# Patient Record
Sex: Male | Born: 1960 | Race: White | Hispanic: No | Smoking: Current every day smoker
Health system: Southern US, Community
[De-identification: ages and names within clinical notes are randomized; demographics above are authoritative.]

## PROBLEM LIST (undated history)

## (undated) DIAGNOSIS — R0602 Shortness of breath: Secondary | ICD-10-CM

## (undated) DIAGNOSIS — F419 Anxiety disorder, unspecified: Secondary | ICD-10-CM

## (undated) HISTORY — PX: HERNIA REPAIR: SHX51

## (undated) HISTORY — DX: Anxiety disorder, unspecified: F41.9

## (undated) HISTORY — PX: WRIST FRACTURE SURGERY: SHX121

## (undated) HISTORY — DX: Shortness of breath: R06.02

---

## 2013-05-22 ENCOUNTER — Ambulatory Visit: Payer: Self-pay | Admitting: Internal Medicine

## 2019-06-28 ENCOUNTER — Other Ambulatory Visit: Payer: Self-pay

## 2019-06-28 ENCOUNTER — Ambulatory Visit
Admission: EM | Admit: 2019-06-28 | Discharge: 2019-06-28 | Disposition: A | Discharge status: Home / Self Care | Payer: BC Managed Care – PPO | Attending: Emergency Medicine | Admitting: Emergency Medicine

## 2019-06-28 DIAGNOSIS — H00012 Hordeolum externum right lower eyelid: Secondary | ICD-10-CM

## 2019-06-28 MED ORDER — DOXYCYCLINE HYCLATE 100 MG PO CAPS: 100.0000 mg | ORAL_CAPSULE | Freq: Two times a day (BID) | ORAL | 0 refills | Status: DC

## 2019-06-28 NOTE — ED Provider Notes (Signed)
[facility]    CSN: [id number] Arrival date & time: 06/28/19  1116     History   Chief Complaint Chief Complaint  Patient presents with  . Eye Pain    HPI Lisabeth DevoidGregory A Plath is a 58 y.o. male presenting with pain and swelling to right eyelid for two weeks. Pt has tried warm compresses and OTC pain meds to help alleviate pain, but pain and swelling has continued to increased. Pt has a history of facial acne, so he believed the "bump" to be a pimple, but wasn't able to manually drain the "pimple". Pt denies fever, chills. additional facial pain, or vision changes. Denies recent bites, MRSA exposure or recent abx use.   Social hx: pt's wife passed away in March. Since her passing, pt noticed an increased amount of stress, and has changed his diet to mostly fast food. Pt hasn't sought out further therapies/counsueling and also hasn't had a PCP for >10 years.   History reviewed. No pertinent past medical history.  There are no active problems to display for this patient.   Past Surgical History:  Procedure Laterality Date  . HERNIA REPAIR    . WRIST FRACTURE SURGERY Left        Home Medications    Family History History reviewed. No pertinent family history.  Social History Social History   Tobacco Use  . Smoking status: Current Every Day Smoker    Packs/day: 0.50    Types: Cigarettes  . Smokeless tobacco: Never Used  Substance Use Topics  . Alcohol use: Yes    Comment: occasionally  . Drug use: Not Currently     Allergies   Patient has no known allergies.   Review of Systems Review of Systems   Physical Exam Triage Vital Signs ED Triage Vitals  Enc Vitals Group     BP 06/28/19 1134 (!) 150/104     Pulse Rate 06/28/19 1134 74     Resp 06/28/19 1134 15     Temp 06/28/19 1134 98.2 F (36.8 C)     Temp Source 06/28/19 1134 Oral     SpO2 06/28/19 1134 97 %     Weight 06/28/19 1132 180 lb (81.6 kg)     Height 06/28/19 1132 6\' 1"  (1.854 m)    Head Circumference --      Peak Flow --      Pain Score 06/28/19 1132 0     Pain Loc --      Pain Edu? --      Excl. in [place]? --    No data found.  Updated Vital Signs BP (!) 150/104 (BP Location: Left Arm)   Pulse 74   Temp 98.2 F (36.8 C) (Oral)   Resp 15   Ht 6\' 1"  (1.854 m)   Wt 180 lb (81.6 kg)   SpO2 97%   BMI 23.75 kg/m   Visual Acuity Right Eye Distance: 20/15 Left Eye Distance: 20/15 Bilateral Distance: 20/15(corrected)  Right Eye Near:   Left Eye Near:    Bilateral Near:     Physical Exam   UC Treatments / Results  Labs (all labs ordered are listed, but only abnormal results are displayed) Labs Reviewed - No data to display  EKG   Radiology No results found.  Procedures Procedures (including critical care time)  Medications Ordered in UC Medications - No data to display  Initial Impression / Assessment and Plan / UC Course  I have reviewed the triage vital signs and  the nursing notes.  Pertinent labs & imaging results that were available during my care of the patient were reviewed by me and considered in my medical decision making (see chart for details).     Pt presents with pain and swelling to right lower eyelid, diagnosed with hordelum and treated as such with the prescribed medications below. Warm compresses suggested and pt told to follow-up with ophthalmologist for possible drainage.   Pt and NP also had a discussion regarding health maintence. Pt given recommendations for PCPs and Counselors in area, NP reviewed lifestyle modifications that could be made to better mange his BP and encouraged smoking cessation. Pt receptive to all the above conversation and education. All questions answered and all concerns addressed.   Final Clinical Impressions(s) / UC Diagnoses   Final diagnoses:  Hordeolum externum of right lower eyelid   Discharge Instructions   None    ED Prescriptions    Medication Sig Dispense Auth. Provider    doxycycline (VIBRAMYCIN) 100 MG capsule Take 1 capsule (100 mg total) by mouth 2 (two) times daily. 20 capsule Gertie Baron, NP      Gertie Baron, DNP, NP-c    Gertie Baron, NP 06/28/19 1218

## 2019-06-28 NOTE — ED Triage Notes (Signed)
Patient complains of eye stye/swelling x 2 weeks. Patient states that area is painful at times.

## 2021-12-17 ENCOUNTER — Ambulatory Visit
Admission: EM | Admit: 2021-12-17 | Discharge: 2021-12-17 | Disposition: A | Discharge status: Home / Self Care | Payer: BC Managed Care – PPO | Attending: Physician Assistant | Admitting: Physician Assistant

## 2021-12-17 ENCOUNTER — Ambulatory Visit (INDEPENDENT_AMBULATORY_CARE_PROVIDER_SITE_OTHER): Payer: BC Managed Care – PPO

## 2021-12-17 ENCOUNTER — Encounter: Payer: Self-pay | Admitting: Emergency Medicine

## 2021-12-17 ENCOUNTER — Other Ambulatory Visit: Payer: Self-pay

## 2021-12-17 DIAGNOSIS — F410 Panic disorder [episodic paroxysmal anxiety] without agoraphobia: Secondary | ICD-10-CM

## 2021-12-17 DIAGNOSIS — R0789 Other chest pain: Secondary | ICD-10-CM

## 2021-12-17 DIAGNOSIS — R251 Tremor, unspecified: Secondary | ICD-10-CM | POA: Diagnosis not present

## 2021-12-17 DIAGNOSIS — R062 Wheezing: Secondary | ICD-10-CM | POA: Diagnosis not present

## 2021-12-17 DIAGNOSIS — Z720 Tobacco use: Secondary | ICD-10-CM | POA: Insufficient documentation

## 2021-12-17 LAB — CBC WITH DIFFERENTIAL/PLATELET
Abs Immature Granulocytes: 0.02 10*3/uL (ref 0.00–0.07)
Basophils Absolute: 0.1 10*3/uL (ref 0.0–0.1)
Basophils Relative: 1 %
Eosinophils Absolute: 0.3 10*3/uL (ref 0.0–0.5)
Eosinophils Relative: 4 %
HCT: 45.6 % (ref 39.0–52.0)
Hemoglobin: 15.5 g/dL (ref 13.0–17.0)
Immature Granulocytes: 0 %
Lymphocytes Relative: 14 %
Lymphs Abs: 1.1 10*3/uL (ref 0.7–4.0)
MCH: 33.5 pg (ref 26.0–34.0)
MCHC: 34 g/dL (ref 30.0–36.0)
MCV: 98.7 fL (ref 80.0–100.0)
Monocytes Absolute: 0.8 10*3/uL (ref 0.1–1.0)
Monocytes Relative: 9 %
Neutro Abs: 5.9 10*3/uL (ref 1.7–7.7)
Neutrophils Relative %: 72 %
Platelets: 228 10*3/uL (ref 150–400)
RBC: 4.62 MIL/uL (ref 4.22–5.81)
RDW: 14.1 % (ref 11.5–15.5)
WBC: 8.2 10*3/uL (ref 4.0–10.5)
nRBC: 0 % (ref 0.0–0.2)

## 2021-12-17 LAB — COMPREHENSIVE METABOLIC PANEL
ALT: 22 U/L (ref 0–44)
AST: 22 U/L (ref 15–41)
Albumin: 4.5 g/dL (ref 3.5–5.0)
Alkaline Phosphatase: 64 U/L (ref 38–126)
Anion gap: 9 (ref 5–15)
BUN: 12 mg/dL (ref 8–23)
CO2: 26 mmol/L (ref 22–32)
Calcium: 9 mg/dL (ref 8.9–10.3)
Chloride: 98 mmol/L (ref 98–111)
Creatinine, Ser: 0.68 mg/dL (ref 0.61–1.24)
GFR, Estimated: >60 mL/min (ref >60)
Glucose, Bld: 100 mg/dL — ABNORMAL HIGH (ref 70–99)
Potassium: 4.2 mmol/L (ref 3.5–5.1)
Sodium: 133 mmol/L — ABNORMAL LOW (ref 135–145)
Total Bilirubin: 0.9 mg/dL (ref 0.3–1.2)
Total Protein: 7.9 g/dL (ref 6.5–8.1)

## 2021-12-17 LAB — TSH: TSH: 1.296 u[IU]/mL (ref 0.350–4.500)

## 2021-12-17 LAB — GLUCOSE, CAPILLARY: Glucose-Capillary: 98 mg/dL (ref 70–99)

## 2021-12-17 MED ORDER — ALBUTEROL SULFATE (2.5 MG/3ML) 0.083% IN NEBU
2.5000 mg | INHALATION_SOLUTION | Freq: Once | RESPIRATORY_TRACT | Status: AC
  Administered 2021-12-17: 2.5 mg via RESPIRATORY_TRACT

## 2021-12-17 MED ORDER — ALBUTEROL SULFATE HFA 108 (90 BASE) MCG/ACT IN AERS: 1.0000 | INHALATION_SPRAY | Freq: Four times a day (QID) | RESPIRATORY_TRACT | 0 refills | Status: DC | PRN

## 2021-12-17 MED ORDER — HYDROXYZINE HCL 25 MG PO TABS: 25.0000 mg | ORAL_TABLET | Freq: Four times a day (QID) | ORAL | 0 refills | Status: AC | PRN

## 2021-12-17 NOTE — ED Provider Notes (Signed)
[facility]    CSN: [id number] Arrival date & time: 12/17/21  1315      History   Chief Complaint Chief Complaint  Patient presents with   Shaking    HPI William Garrett is a 61 y.o. male presenting for concerns about chest pressure and shaking/jittery feeling that has happened to him recently twice.  Patient reports an episode yesterday and 1 today.  Yesterday he says he was just sitting on the couch when it happened.  Today he says he was doing some yard work outside when he suddenly started to feel chest pressure and then felt short of breath.  Patient says he sat down and focused on taking deep breaths in and out and then started feel little better but still shaky.  That is when he came to urgent care today.  He is not currently experiencing any chest pressure.  He does not describe it as pain.  He says he is a smoker and he gets worried about that.  Patient says he has not seen a PCP in a very long time.  He cannot member the last time he has had labs performed.  In addition to the tobacco use, he does report 3 beers and 4 shots of tequila every night.  Patient reports he did not drink Monday or Tuesday night but he has drank the past 2 nights.  Reports he wants to stop drinking and smoking.  Patient says he is not under more stress than normal.  He does report that his wife died 3 years ago.  He also reports that he did not see his family over the holidays.  Reports that he is close with his children and they do not live too far away.  Patient says that the shaking and jittery feeling goes away when he is focused on something else.  He reports that when he was on his way driving to the urgent care he did not really have any symptoms.  Then, when he got here he started to feel more shaky and jittery.  Denies any known medical problems.  No history of anxiety or depression.  No other concerns.  HPI  History reviewed. No pertinent past medical history.  There are no problems to  display for this patient.   Past Surgical History:  Procedure Laterality Date   HERNIA REPAIR     WRIST FRACTURE SURGERY Left        Home Medications    Prior to Admission medications   Medication Sig Start Date End Date Taking? Authorizing Provider  albuterol (VENTOLIN HFA) 108 (90 Base) MCG/ACT inhaler Inhale 1-2 puffs into the lungs every 6 (six) hours as needed for wheezing or shortness of breath. 12/17/21  Yes Danton Clap, PA-C  hydrOXYzine (ATARAX) 25 MG tablet Take 1 tablet (25 mg total) by mouth every 6 (six) hours as needed for up to 10 days for anxiety. 12/17/21 12/27/21 Yes Danton Clap, PA-C    Family History History reviewed. No pertinent family history.  Social History Social History   Tobacco Use   Smoking status: Every Day    Packs/day: 0.50    Types: Cigarettes   Smokeless tobacco: Never  Vaping Use   Vaping Use: Never used  Substance Use Topics   Alcohol use: Yes    Comment: occasionally   Drug use: Not Currently     Allergies   Patient has no known allergies.   Review of Systems Review of Systems  Constitutional:  Negative for fatigue and fever.  Eyes:  Negative for visual disturbance.  Respiratory:  Positive for shortness of breath.   Cardiovascular:  Positive for chest pain (chest pressure). Negative for palpitations.  Gastrointestinal:  Negative for abdominal pain, nausea and vomiting.  Musculoskeletal:  Negative for myalgias.  Neurological:  Positive for tremors (jittery/shaky). Negative for dizziness, syncope, facial asymmetry, weakness, light-headedness, numbness and headaches.  Psychiatric/Behavioral:  Positive for agitation. Negative for dysphoric mood and suicidal ideas. The patient is nervous/anxious.     Physical Exam Triage Vital Signs ED Triage Vitals  Enc Vitals Group     BP 12/17/21 1326 (!) 157/103     Pulse Rate 12/17/21 1326 75     Resp 12/17/21 1326 15     Temp 12/17/21 1326 98.6 F (37 C)     Temp Source  12/17/21 1326 Oral     SpO2 12/17/21 1326 98 %     Weight 12/17/21 1324 157 lb (71.2 kg)     Height 12/17/21 1324 6\' 1"  (1.854 m)     Head Circumference --      Peak Flow --      Pain Score 12/17/21 1324 0     Pain Loc --      Pain Edu? --      Excl. in [place]? --    No data found.  Updated Vital Signs BP (!) 157/103 (BP Location: Left Arm)    Pulse 75    Temp 98.6 F (37 C) (Oral)    Resp 15    Ht 6\' 1"  (1.854 m)    Wt 157 lb (71.2 kg)    SpO2 98%    BMI 20.71 kg/m       Physical Exam Vitals and nursing note reviewed.  Constitutional:      General: He is not in acute distress.    Appearance: Normal appearance. He is well-developed. He is not ill-appearing.  HENT:     Head: Normocephalic and atraumatic.     Nose: Nose normal.     Mouth/Throat:     Mouth: Mucous membranes are moist.     Pharynx: Oropharynx is clear.  Eyes:     General: No scleral icterus.    Conjunctiva/sclera: Conjunctivae normal.  Cardiovascular:     Rate and Rhythm: Normal rate and regular rhythm.     Heart sounds: Normal heart sounds.  Pulmonary:     Effort: Pulmonary effort is normal. No respiratory distress.     Breath sounds: Wheezing (diffuse wheezing throughout all lung fields) present. No rhonchi or rales.  Musculoskeletal:     Cervical back: Neck supple.     Right lower leg: No edema.     Left lower leg: No edema.  Skin:    General: Skin is warm and dry.     Capillary Refill: Capillary refill takes less than 2 seconds.  Neurological:     General: No focal deficit present.     Mental Status: He is alert and oriented to person, place, and time.     Motor: No weakness.     Coordination: Coordination normal.     Comments: Voice shakes at times and so do his arms and hands.  Psychiatric:        Attention and Perception: Attention normal.        Mood and Affect: Mood is anxious.        Speech: Speech is rapid and pressured.        Behavior: Behavior is cooperative.  UC Treatments /  Results  Labs (all labs ordered are listed, but only abnormal results are displayed) Labs Reviewed  COMPREHENSIVE METABOLIC PANEL - Abnormal; Notable for the following components:      Result Value   Sodium 133 (*)    Glucose, Bld 100 (*)    All other components within normal limits  GLUCOSE, CAPILLARY  CBC WITH DIFFERENTIAL/PLATELET  TSH  CBG MONITORING, ED    EKG   Radiology DG Chest 2 View  Result Date: 12/17/2021 CLINICAL DATA:  Jittery feeling for the past few weeks. EXAM: CHEST - 2 VIEW COMPARISON:  None. FINDINGS: The heart size and mediastinal contours are within normal limits. Normal pulmonary vascularity. The lungs are hyperinflated. No focal consolidation, pleural effusion, or pneumothorax. No acute osseous abnormality. IMPRESSION: 1. No acute cardiopulmonary disease. Electronically Signed   By: Titus Dubin M.D.   On: 12/17/2021 14:33    Procedures ED EKG  Date/Time: 12/17/2021 3:46 PM Performed by: Danton Clap, PA-C Authorized by: Danton Clap, PA-C   ECG reviewed by ED Physician in the absence of a cardiologist: yes   Previous ECG:    Previous ECG:  Unavailable Interpretation:    Interpretation: normal   Rate:    ECG rate:  62   ECG rate assessment: normal   Rhythm:    Rhythm: sinus rhythm   Ectopy:    Ectopy: none   QRS:    QRS axis:  Normal   QRS intervals:  Normal   QRS conduction: normal   ST segments:    ST segments:  Normal T waves:    T waves: normal   Comments:     Normal sinus rhythm with regular rate (including critical care time)  Medications Ordered in UC Medications  albuterol (PROVENTIL) (2.5 MG/3ML) 0.083% nebulizer solution 2.5 mg (2.5 mg Nebulization Given 12/17/21 1440)    Initial Impression / Assessment and Plan / UC Course  I have reviewed the triage vital signs and the nursing notes.  Pertinent labs & imaging results that were available during my care of the patient were reviewed by me and considered in my medical  decision making (see chart for details).  61 year old male presenting for concerns regarding feeling shaky and jittery and also having chest pressure.  Reports an episode that occurred yesterday and today.  He says both lasted for a couple of hours.  Symptoms improve when he is distracted or if he focuses on his breathing.  BP elevated 157/103 but patient is visibly anxious and shaky.  He has diffuse wheezing throughout all lung fields.  Heart regular  CBC and CMP are unrevealing.  TSH ordered and pending.  Chest x-ray obtained to assess for evidence of COPD, pneumonia, masses.  Imaging normal.  Discussed this with patient but advised him that I still believe he may have mild underlying COPD given that he has been a longtime smoker and has wheezing throughout his chest.  I believe he may have gotten short of breath and this caused him to have a chest pressure started to panic.  Patient was given albuterol nebulizer in clinic and he reported that it helped him to breathe better.  Advised him that I have sent an albuterol inhaler to pharmacy.  If he has another episode like this, advised him to use the albuterol but if that does not help then he should focus on his breathing and taking the hydroxyzine that I sent to the pharmacy.  Had a long discussion about  discontinuing his tobacco use and alcohol use, and I advised him to do so slowly.  Also encouraged him to order other primary care office on Monday to get set up with a PCP.  Patient given information crisis center in case his symptoms were to worsen.  Also advised to be eating.   Final Clinical Impressions(s) / UC Diagnoses   Final diagnoses:  Panic attack  Shaky  Chest pressure  Tobacco abuse  Wheezing     Discharge Instructions      -Your lab work looks good.  The thyroid test will be back tomorrow and someone will contact you if it is abnormal. - Chest x-ray is normal but I still believe you probably have mild underlying COPD.  It  would need to be diagnosed with pulmonary function testing so it is important that you make a follow-up with the PCP as soon as possible. - Slowly decrease your alcohol intake and eventually quit drinking.  Same with the tobacco use. - Continue to try to focus on your breathing and meditate.  I have sent a medication you can take if you feel like you are having a panic attack.  It might make you little drowsy so make sure you are home when you take it.  -I am glad the albuterol nebulizer helps.  Have sent inhaler to the pharmacy.  Contact for appointment: [facility] at [facility], [place], [address]. Phone number: [phone] Please call this number to establish with primary care   If you are in crisis:  [place], [facility] [address], [place], [place] [address] Same-Day Access Hours:  Monday, Wednesday and Friday, [place]: Walk-In Crisis Hours: 7 days/week, 8am - 12am     ED Prescriptions     Medication Sig Dispense Auth. Provider   hydrOXYzine (ATARAX) 25 MG tablet Take 1 tablet (25 mg total) by mouth every 6 (six) hours as needed for up to 10 days for anxiety. 40 tablet Laurene Footman B, PA-C   albuterol (VENTOLIN HFA) 108 (90 Base) MCG/ACT inhaler Inhale 1-2 puffs into the lungs every 6 (six) hours as needed for wheezing or shortness of breath. 1 g Danton Clap, PA-C      I have reviewed the PDMP during this encounter.   Danton Clap, PA-C 12/17/21 1551

## 2021-12-17 NOTE — ED Triage Notes (Signed)
Patient states that he has off and on shaking or jittery feeling that occurs off and on for the past couple of weeks.  Patient reports taking a friend's Vicodin pills (4 ) over the past couple of weeks.  Patient denies any stress or depression.  Patient denies any chest pain or SOB.

## 2021-12-17 NOTE — Discharge Instructions (Addendum)
-  Your lab work looks good.  The thyroid test will be back tomorrow and someone will contact you if it is abnormal. - Chest x-ray is normal but I still believe you probably have mild underlying COPD.  It would need to be diagnosed with pulmonary function testing so it is important that you make a follow-up with the PCP as soon as possible. - Slowly decrease your alcohol intake and eventually quit drinking.  Same with the tobacco use. - Continue to try to focus on your breathing and meditate.  I have sent a medication you can take if you feel like you are having a panic attack.  It might make you little drowsy so make sure you are home when you take it.  -I am glad the albuterol nebulizer helps.  Have sent inhaler to the pharmacy.  Contact for appointment: [facility] at [facility], [place], [place], [address]. Phone number: [phone] Please call this number to establish with primary care   If you are in crisis:  [place], [facility] [address], [place], [place] [address] Same-Day Access Hours:  Monday, Wednesday and Friday, [place]: Walk-In Crisis Hours: 7 days/week, 8am - 12am

## 2023-06-22 ENCOUNTER — Ambulatory Visit
Admission: EM | Admit: 2023-06-22 | Discharge: 2023-06-22 | Disposition: A | Discharge status: Home / Self Care | Payer: BC Managed Care – PPO | Attending: Physician Assistant | Admitting: Physician Assistant

## 2023-06-22 ENCOUNTER — Other Ambulatory Visit: Payer: Self-pay

## 2023-06-22 DIAGNOSIS — F419 Anxiety disorder, unspecified: Secondary | ICD-10-CM | POA: Diagnosis not present

## 2023-06-22 DIAGNOSIS — R0602 Shortness of breath: Secondary | ICD-10-CM

## 2023-06-22 DIAGNOSIS — Z720 Tobacco use: Secondary | ICD-10-CM | POA: Diagnosis not present

## 2023-06-22 DIAGNOSIS — R079 Chest pain, unspecified: Secondary | ICD-10-CM | POA: Diagnosis not present

## 2023-06-22 MED ORDER — HYDROXYZINE HCL 25 MG PO TABS: 25.0000 mg | ORAL_TABLET | Freq: Four times a day (QID) | ORAL | 0 refills | Status: AC | PRN

## 2023-06-22 MED ORDER — ALBUTEROL SULFATE HFA 108 (90 BASE) MCG/ACT IN AERS: 1.0000 | INHALATION_SPRAY | Freq: Four times a day (QID) | RESPIRATORY_TRACT | 0 refills | Status: DC | PRN

## 2023-06-22 NOTE — ED Provider Notes (Signed)
[facility]    CSN: [id number] Arrival date & time: 06/22/23  0809      History   Chief Complaint Chief Complaint  Patient presents with   Anxiety   Shortness of Breath    HPI William Garrett is a 62 y.o. male presenting for shortness of breath intermittently, more so when active for the past couple of years.  Patient is a longtime smoker.  He also reports a history of anxiety.  He was seen here a year and a half ago by myself.  Patient says he does not feel nearly as badly today as he did a year and a half ago.  Presentation was consistent with anxiety attack and COPD.  An x-ray was performed which did not show any obvious COPD but he did have a lot of wheezing and he reported improvement with albuterol inhaler and cutting back on smoking.  He also reports that the hydroxyzine calms him down within 30 to 40 minutes of taking it and he is made the hydroxyzine last for a year and a half as he has anxiety attacks about once a month, sometimes a little more frequent or less frequent depending on "what is going on in my life."  He says when he has a anxiety attack it causes a little bit of chest pressure and a jittery feeling.  He reports he finished his hydroxyzine yesterday that he had leftover.  He states his mother broke her hip a few weeks ago and then his best friend passed away unexpectedly 2 days ago.  He reports loss of his wife 4 years ago.  Patient states he has a lot of stressors and things that make him anxious.  He does report he has a lot of close friends and family who support him.  In addition to the tobacco abuse he does report alcohol abuse.  States he has decreased his alcohol consumption to a couple of beers a day.  Previously he was drinking 3 or more beers a day and 4 shots of tequila.  He reports he rarely has shots of hard liquor anymore.  He is not currently experiencing any chest pressure.  He does not describe it as pain.  He says he is mostly here to get a  refill of the albuterol inhaler and hydroxyzine since it has treated his conditions well.  He did not follow-up with a primary care provider as advised a year and half ago.  He says he just did not get around to it.  He is open to making an appointment today.  HPI  History reviewed. No pertinent past medical history.  There are no problems to display for this patient.   Past Surgical History:  Procedure Laterality Date   HERNIA REPAIR     WRIST FRACTURE SURGERY Left        Home Medications    Prior to Admission medications   Medication Sig Start Date End Date Taking? Authorizing Provider  hydrOXYzine (ATARAX) 25 MG tablet Take 1 tablet (25 mg total) by mouth every 6 (six) hours as needed for up to 10 days for anxiety. 06/22/23 07/02/23 Yes Shirlee Latch, PA-C  albuterol (VENTOLIN HFA) 108 (90 Base) MCG/ACT inhaler Inhale 1-2 puffs into the lungs every 6 (six) hours as needed for wheezing or shortness of breath. 06/22/23   Shirlee Latch, PA-C    Family History History reviewed. No pertinent family history.  Social History Social History   Tobacco Use  Smoking status: Every Day    Current packs/day: 0.50    Types: Cigarettes   Smokeless tobacco: Never  Vaping Use   Vaping status: Never Used  Substance Use Topics   Alcohol use: Yes    Comment: occasionally   Drug use: Not Currently     Allergies   Patient has no known allergies.   Review of Systems Review of Systems  Constitutional:  Negative for fatigue and fever.  Eyes:  Negative for visual disturbance.  Respiratory:  Positive for shortness of breath and wheezing. Negative for chest tightness.   Cardiovascular:  Negative for chest pain (chest pressure), palpitations and leg swelling.  Gastrointestinal:  Negative for abdominal pain, nausea and vomiting.  Musculoskeletal:  Negative for myalgias.  Neurological:  Positive for tremors (jittery/shaky). Negative for dizziness, syncope, facial asymmetry, weakness,  light-headedness, numbness and headaches.  Psychiatric/Behavioral:  Positive for agitation. Negative for dysphoric mood and suicidal ideas. The patient is nervous/anxious.      Physical Exam Triage Vital Signs ED Triage Vitals  Enc Vitals Group     BP 12/17/21 1326 (!) 157/103     Pulse Rate 12/17/21 1326 75     Resp 12/17/21 1326 15     Temp 12/17/21 1326 98.6 F (37 C)     Temp Source 12/17/21 1326 Oral     SpO2 12/17/21 1326 98 %     Weight 12/17/21 1324 157 lb (71.2 kg)     Height 12/17/21 1324 6\' 1"  (1.854 m)     Head Circumference --      Peak Flow --      Pain Score 12/17/21 1324 0     Pain Loc --      Pain Edu? --      Excl. in GC? --    No data found.  Updated Vital Signs BP 118/88   Pulse 82   Temp 98.3 F (36.8 C)   Resp 20   SpO2 96%       Physical Exam Vitals and nursing note reviewed.  Constitutional:      General: He is not in acute distress.    Appearance: Normal appearance. He is well-developed. He is not ill-appearing.  HENT:     Head: Normocephalic and atraumatic.     Nose: Nose normal.     Mouth/Throat:     Mouth: Mucous membranes are moist.     Pharynx: Oropharynx is clear.  Eyes:     General: No scleral icterus.    Conjunctiva/sclera: Conjunctivae normal.  Cardiovascular:     Rate and Rhythm: Normal rate and regular rhythm.     Heart sounds: Normal heart sounds.  Pulmonary:     Effort: Pulmonary effort is normal. No respiratory distress.     Breath sounds: Wheezing (diffuse wheezing throughout all lung fields) present. No rhonchi or rales.  Musculoskeletal:     Cervical back: Neck supple.     Right lower leg: No edema.     Left lower leg: No edema.  Skin:    General: Skin is warm and dry.     Capillary Refill: Capillary refill takes less than 2 seconds.  Neurological:     General: No focal deficit present.     Mental Status: He is alert and oriented to person, place, and time.     Motor: No weakness.     Coordination:  Coordination normal.  Psychiatric:        Attention and Perception: Attention normal.  Mood and Affect: Mood normal.        Speech: Speech normal.        Behavior: Behavior is cooperative.      UC Treatments / Results  Labs (all labs ordered are listed, but only abnormal results are displayed) Labs Reviewed - No data to display   EKG   Radiology No results found.  Procedures ED EKG  Date/Time: 06/22/2023 9:51 AM  Performed by: Shirlee Latch, PA-C Authorized by: Shirlee Latch, PA-C   Previous ECG:    Previous ECG:  Compared to current   Similarity:  Changes noted   Comparison ECG info:  Short PR Interpretation:    Interpretation: abnormal   Quality:    Tracing quality:  Limited by artifact Rate:    ECG rate:  79   ECG rate assessment: normal   Rhythm:    Rhythm: sinus rhythm and A-V block     A-V block: 1st Degree   Ectopy:    Ectopy: none   QRS:    QRS axis:  Normal   QRS intervals:  Normal   QRS conduction: normal   ST segments:    ST segments:  Normal T waves:    T waves: normal   Comments:     Normal sinus rhythm.  Regular rate.  Short PR interval.  (including critical care time)  Medications Ordered in UC Medications - No data to display   Initial Impression / Assessment and Plan / UC Course  I have reviewed the triage vital signs and the nursing notes.  Pertinent labs & imaging results that were available during my care of the patient were reviewed by me and considered in my medical decision making (see chart for details).  62 year old male presenting for concerns regarding feeling intermittent anxiety and shortness of breath over the past couple years.  Seen here a year and a half ago and did have workup performed at that time.  I saw him during his previous visit.  He was given albuterol inhaler for suspected mild underlying COPD since he is a longtime smoker.  Also suspected to have anxiety/panic attacks related to environmental  factors.  Patient reports feeling better at this time that he did a year and a half ago.  Reports the albuterol helps his breathing and wheezing.  He says the hydroxyzine calms him down a lot.  Vitals are all normal and stable.  He does not appear overly anxious or in any distress.  He has diffuse wheezing throughout all lung fields.  Heart regular rate and rhythm.  EKG performed today shows normal sinus rhythm and rate.  Artifact noted.  Short PR compared to previous EKG.  Patient symptoms still appear to be consistent with mild underlying COPD and panic attack.  It seems that he has cut back on smoking and drinking.  Advised him to continue to cut back.  Advised him that I have sent an albuterol inhaler to pharmacy.  If he has another episode like this, advised him to use the albuterol but if that does not help then he should focus on his breathing and taking the hydroxyzine that I sent to the pharmacy.  Had a long discussion about discontinuing his tobacco use and alcohol use, and I advised him to do so slowly.  Also encouraged him to follow-up with a PCP.  Nursing staff made him an appointment for next month.  Patient given information crisis center in case his symptoms were to worsen.  Final Clinical Impressions(s) / UC Diagnoses   Final diagnoses:  Anxiety  Shortness of breath  Tobacco abuse     Discharge Instructions      - I still believe you probably have mild underlying COPD.  It would need to be diagnosed with pulmonary function testing so it is important that you follow-up with the PCP as soon as possible. I refilled the inhaler.  - Continue slowly decreasing your alcohol intake and eventually quit drinking.  Same with the tobacco use. - Continue to try to focus on your breathing and meditate.  I have sent a medication you can take if you feel like you are having a panic attack. I am glad this medication helped you before. Keep leaning on friends and family for support.   If  you are in crisis:  [place], [facility] [address], [place], [place] [address] Same-Day Access Hours:  Monday, Wednesday and Friday, 8am - 3pm Crisis & Diversion Center: Walk-In Crisis Hours: 7 days/week, 8am - 12am      ED Prescriptions     Medication Sig Dispense Auth. Provider   albuterol (VENTOLIN HFA) 108 (90 Base) MCG/ACT inhaler Inhale 1-2 puffs into the lungs every 6 (six) hours as needed for wheezing or shortness of breath. 1 g Eusebio Friendly B, PA-C   hydrOXYzine (ATARAX) 25 MG tablet Take 1 tablet (25 mg total) by mouth every 6 (six) hours as needed for up to 10 days for anxiety. 40 tablet Gareth Morgan      PDMP not reviewed this encounter.     Shirlee Latch, PA-C 06/22/23 [phone]

## 2023-06-22 NOTE — ED Triage Notes (Addendum)
Pt has been experiencing intermittent SOB for a few days feels it is anxiety related due to multiple family losses and the hospitalization of his mom. Has a smokers cough but denies cough is new but admits to low grade fever on Saturday.

## 2023-06-22 NOTE — Discharge Instructions (Addendum)
-   I still believe you probably have mild underlying COPD.  It would need to be diagnosed with pulmonary function testing so it is important that you follow-up with the PCP as soon as possible. I refilled the inhaler.  - Continue slowly decreasing your alcohol intake and eventually quit drinking.  Same with the tobacco use. - Continue to try to focus on your breathing and meditate.  I have sent a medication you can take if you feel like you are having a panic attack. I am glad this medication helped you before. Keep leaning on friends and family for support.   If you are in crisis:  [place], [facility] [address] [place], [place] [address] Same-Day Access Hours:  Monday, Wednesday and Friday, 8am - 3pm Crisis & Diversion Center: Walk-In Crisis Hours: 7 days/week, 8am - 12am

## 2023-07-18 ENCOUNTER — Encounter: Payer: Self-pay | Admitting: General Practice

## 2023-08-01 ENCOUNTER — Encounter: Payer: Self-pay | Admitting: Physician Assistant

## 2023-08-01 ENCOUNTER — Ambulatory Visit: Payer: BC Managed Care – PPO | Admitting: Physician Assistant

## 2023-08-01 VITALS — BP 124/88 | HR 85 | Temp 97.9°F | Ht 73.0 in | Wt 162.0 lb

## 2023-08-01 DIAGNOSIS — Z13220 Encounter for screening for lipoid disorders: Secondary | ICD-10-CM

## 2023-08-01 DIAGNOSIS — J410 Simple chronic bronchitis: Secondary | ICD-10-CM | POA: Insufficient documentation

## 2023-08-01 DIAGNOSIS — Z0000 Encounter for general adult medical examination without abnormal findings: Secondary | ICD-10-CM | POA: Diagnosis not present

## 2023-08-01 DIAGNOSIS — Z114 Encounter for screening for human immunodeficiency virus [HIV]: Secondary | ICD-10-CM

## 2023-08-01 DIAGNOSIS — Z23 Encounter for immunization: Secondary | ICD-10-CM

## 2023-08-01 DIAGNOSIS — F17200 Nicotine dependence, unspecified, uncomplicated: Secondary | ICD-10-CM | POA: Diagnosis not present

## 2023-08-01 DIAGNOSIS — Z1159 Encounter for screening for other viral diseases: Secondary | ICD-10-CM

## 2023-08-01 DIAGNOSIS — Z125 Encounter for screening for malignant neoplasm of prostate: Secondary | ICD-10-CM

## 2023-08-01 DIAGNOSIS — Z1211 Encounter for screening for malignant neoplasm of colon: Secondary | ICD-10-CM

## 2023-08-01 NOTE — Progress Notes (Signed)
Date:  08/01/2023   Name:  William Garrett   DOB:  05-09-1961   MRN:  [id number]   Chief Complaint: Establish Care and Cough (Had chest xray in 2023 normal stated it may be the beginning of COPD)  HPI  William Garrett is a 62 y.o. male who presents today for his Complete Annual Exam. He feels well. He reports exercising at work. He reports he is sleeping well.   Last Dental Exam: >3y ago Last Eye Exam: 3y ago Last CRC screen: never Last PSA: never  William Garrett is a very pleasant 62 year old male with a history of cigarette smoking but otherwise seemingly healthy, new to the practice today to establish care.  He has been lost to primary care for decades and generally does not like doctors visits.  Naturally he is overdue for all cancer screenings.  Cigarette smoking has been light (less than 5 cigarettes/day) but prolonged ~45 years duration.  He is working on quitting gradually.  He was seen in urgent care January 2023 for chest tightness and "jitteriness", thought to be related to anxiety and/or alcohol use; chest x-ray from this date significant for hyperinflation bilaterally to suggest COPD when combined with chronic wheezing in all lung fields.  It was recommended that he establish with PCP at the time, but he did not feel it was necessary.  Albuterol helps with breathing, so he uses his inhaler sparingly (usually every 1 to 2 weeks as needed).  He was prescribed hydroxyzine for as needed use for anxiety, which has been working well.  More recently he was seen in urgent care 06/22/2023 once again for anxiety/shortness of breath.  Alcohol consumption had decreased to about 2 beers per day.  Immunization History  Administered Date(s) Administered   Tdap 08/01/2023   Health Maintenance Due  Topic Date Due   HIV Screening  Never done   Hepatitis C Screening  Never done   Colonoscopy  Never done   Zoster Vaccines- Shingrix (1 of 2) Never done   COVID-19 Vaccine (1 - 2023-24 season) Never  done   INFLUENZA VACCINE  07/13/2023    No results found for: "PSA1", "PSA"         Medication list has been reviewed and updated.  Current Meds  Medication Sig   albuterol (VENTOLIN HFA) 108 (90 Base) MCG/ACT inhaler Inhale 1-2 puffs into the lungs every 6 (six) hours as needed for wheezing or shortness of breath.   hydrOXYzine (ATARAX) 25 MG tablet Take 25 mg by mouth 3 (three) times daily as needed for anxiety.     Review of Systems  Constitutional:  Negative for activity change, appetite change, fatigue and unexpected weight change.  HENT:  Negative for dental problem, hearing loss and trouble swallowing.   Eyes:  Negative for visual disturbance.  Respiratory:  Positive for cough, shortness of breath and wheezing. Negative for chest tightness.   Cardiovascular:  Negative for chest pain, palpitations and leg swelling.  Gastrointestinal:  Negative for abdominal distention, abdominal pain, blood in stool, constipation and diarrhea.  Endocrine: Negative for polydipsia, polyphagia and polyuria.  Genitourinary:  Negative for difficulty urinating, dysuria, enuresis, frequency, hematuria and testicular pain.  Musculoskeletal:  Negative for arthralgias, gait problem and joint swelling.  Skin:  Negative for rash and wound.  Neurological:  Negative for dizziness, syncope, weakness and headaches.  Psychiatric/Behavioral:  Negative for behavioral problems and sleep disturbance.     There are no problems to display for this patient.  No Known Allergies  Immunization History  Administered Date(s) Administered   Tdap 08/01/2023    Past Surgical History:  Procedure Laterality Date   HERNIA REPAIR     WRIST FRACTURE SURGERY Left     Social History   Tobacco Use   Smoking status: Every Day    Current packs/day: 0.25    Average packs/day: 0.3 packs/day for 45.6 years (11.4 ttl pk-yrs)    Types: Cigarettes    Start date: 1979   Smokeless tobacco: Never  Vaping Use    Vaping status: Never Used  Substance Use Topics   Alcohol use: Yes    Comment: occasionally   Drug use: Not Currently    Family History  Problem Relation Age of Onset   Stroke Father    Diabetes Father         08/01/2023    8:38 AM  GAD 7 : Generalized Anxiety Score  Nervous, Anxious, on Edge 0  Control/stop worrying 0  Worry too much - different things 0  Trouble relaxing 0  Restless 0  Easily annoyed or irritable 0  Afraid - awful might happen 0  Total GAD 7 Score 0  Anxiety Difficulty Not difficult at all       08/01/2023    8:38 AM  Depression screen PHQ 2/9  Decreased Interest 0  Down, Depressed, Hopeless 0  PHQ - 2 Score 0  Altered sleeping 0  Tired, decreased energy 0  Change in appetite 0  Feeling bad or failure about yourself  0  Trouble concentrating 0  Moving slowly or fidgety/restless 0  Suicidal thoughts 0  PHQ-9 Score 0  Difficult doing work/chores Not difficult at all    BP Readings from Last 3 Encounters:  08/01/23 124/88  06/22/23 118/88  12/17/21 (!) 157/103    Wt Readings from Last 3 Encounters:  08/01/23 162 lb (73.5 kg)  12/17/21 157 lb (71.2 kg)  06/28/19 180 lb (81.6 kg)    BP 124/88   Pulse 85   Temp 97.9 F (36.6 C) (Oral)   Ht 6\' 1"  (1.854 m)   Wt 162 lb (73.5 kg)   SpO2 93%   BMI 21.37 kg/m   Physical Exam Vitals and nursing note reviewed.  Constitutional:      Appearance: Normal appearance.  HENT:     Ears:     Comments: EAC clear bilaterally with good view of TM which is without effusion or erythema.     Nose: Nose normal.     Mouth/Throat:     Mouth: Mucous membranes are moist. No oral lesions.     Dentition: Normal dentition.     Pharynx: No posterior oropharyngeal erythema.  Eyes:     Extraocular Movements: Extraocular movements intact.     Conjunctiva/sclera: Conjunctivae normal.     Pupils: Pupils are equal, round, and reactive to light.  Neck:     Thyroid: No thyromegaly.  Cardiovascular:     Rate  and Rhythm: Normal rate and regular rhythm.     Heart sounds: No murmur heard.    No friction rub. No gallop.     Comments: Pulses 2+ at radial, PT, DP bilaterally. No carotid bruit. No peripheral edema Pulmonary:     Effort: Pulmonary effort is normal.     Breath sounds: Wheezing (all fields) present. No rales.  Abdominal:     General: Bowel sounds are normal.     Palpations: Abdomen is soft. There is no mass.  Tenderness: There is no abdominal tenderness.  Genitourinary:    Comments: Deferred. Reviewed self-exam technique Musculoskeletal:     Comments: Full ROM with strength 5/5 bilateral upper and lower extremities  Lymphadenopathy:     Cervical: No cervical adenopathy.  Skin:    General: Skin is warm.     Capillary Refill: Capillary refill takes less than 2 seconds.     Findings: No lesion or rash.  Neurological:     Mental Status: He is alert and oriented to person, place, and time.     Gait: Gait is intact.  Psychiatric:        Mood and Affect: Mood normal.        Behavior: Behavior normal.     Recent Labs     Component Value Date/Time   NA 133 (L) 12/17/2021 1435   K 4.2 12/17/2021 1435   CL 98 12/17/2021 1435   CO2 26 12/17/2021 1435   GLUCOSE 100 (H) 12/17/2021 1435   BUN 12 12/17/2021 1435   CREATININE 0.68 12/17/2021 1435   CALCIUM 9.0 12/17/2021 1435   PROT 7.9 12/17/2021 1435   ALBUMIN 4.5 12/17/2021 1435   AST 22 12/17/2021 1435   ALT 22 12/17/2021 1435   ALKPHOS 64 12/17/2021 1435   BILITOT 0.9 12/17/2021 1435   GFRNONAA >60 12/17/2021 1435    Lab Results  Component Value Date   WBC 8.2 12/17/2021   HGB 15.5 12/17/2021   HCT 45.6 12/17/2021   MCV 98.7 12/17/2021   PLT 228 12/17/2021   No results found for: "HGBA1C" No results found for: "CHOL", "HDL", "LDLCALC", "LDLDIRECT", "TRIG", "CHOLHDL" Lab Results  Component Value Date   TSH 1.296 12/17/2021     Assessment and Plan:  1. Annual physical exam Mostly healthy patient with minor  abnormalities on exam. Encouraged healthy lifestyle including regular physical activity and consumption of whole fruits and vegetables.  Encouraged routine eye and dental exams, as well as avoidance of harmful substances including tobacco and alcohol.  Obtain baseline labs today.  - CBC with Differential/Platelet - Comprehensive metabolic panel - TSH  2. Smokers' cough (HCC) Very likely has COPD with hyperinflation on x-ray from 2023 and wheezing heard in all lung fields.  Continue albuterol inhaler as needed for now.  Consider pulmonology referral for PFTs in the future.  3. Tobacco use disorder Encouraged cessation.  Patient is not quite ready to quit, says he enjoys a cigarette with coffee or in social settings.  Aware of options for nicotine replacement therapy or pharmacotherapy when ready  4. Need for Tdap vaccination Tdap booster administered today - Tdap vaccine greater than or equal to 7yo IM  5. Screening for HIV (human immunodeficiency virus) One-time HIV screen today - HIV Antibody (routine testing w rflx)  6. Need for hepatitis C screening test One-time hep C screen today - Hepatitis C antibody  7. Screening for colon cancer Suggested colonoscopy might be best in this patient who is late to begin screening and has long smoking history, but refused.  He was willing however to proceed with Cologuard as initial CRC screening. - Cologuard  8. Screening PSA (prostate specific antigen) Check PSA today - PSA  9. Screening for hyperlipidemia Check lipids - Lipid panel   Return in about 1 year (around 07/31/2024) for CPE.  Sooner pending lab results   Alvester Morin, PA-C, DMSc, Nutritionist [facility] [phone]

## 2023-08-01 NOTE — Patient Instructions (Signed)
-  It was a pleasure to see you today! Please review your visit summary for helpful information -Lab results are usually available within 1-2 days and we will call once reviewed -I would encourage you to follow your care via MyChart where you can access lab results, notes, messages, and more -If you feel that we did a nice job today, please complete your after-visit survey and leave us a Google review! Your CMA today was Kieandra and your provider was Dan Waddell, PA-C, DMSc -Please return for follow-up in about 1y   

## 2023-08-02 ENCOUNTER — Other Ambulatory Visit: Payer: Self-pay | Admitting: Physician Assistant

## 2023-08-02 DIAGNOSIS — E7800 Pure hypercholesterolemia, unspecified: Secondary | ICD-10-CM

## 2023-08-02 DIAGNOSIS — R972 Elevated prostate specific antigen [PSA]: Secondary | ICD-10-CM

## 2023-08-02 LAB — COMPREHENSIVE METABOLIC PANEL
ALT: 16 IU/L (ref 0–44)
AST: 17 IU/L (ref 0–40)
Albumin: 4.4 g/dL (ref 3.9–4.9)
Alkaline Phosphatase: 88 IU/L (ref 44–121)
BUN/Creatinine Ratio: 16 (ref 10–24)
BUN: 12 mg/dL (ref 8–27)
Bilirubin Total: 0.6 mg/dL (ref 0.0–1.2)
CO2: 26 mmol/L (ref 20–29)
Calcium: 9.3 mg/dL (ref 8.6–10.2)
Chloride: 97 mmol/L (ref 96–106)
Creatinine, Ser: 0.75 mg/dL — ABNORMAL LOW (ref 0.76–1.27)
Globulin, Total: 3 g/dL (ref 1.5–4.5)
Glucose: 103 mg/dL — ABNORMAL HIGH (ref 70–99)
Potassium: 4.7 mmol/L (ref 3.5–5.2)
Sodium: 136 mmol/L (ref 134–144)
Total Protein: 7.4 g/dL (ref 6.0–8.5)
eGFR: 102 mL/min/{1.73_m2} (ref >59)

## 2023-08-02 LAB — CBC WITH DIFFERENTIAL/PLATELET
Basophils Absolute: 0.1 10*3/uL (ref 0.0–0.2)
Basos: 1 %
EOS (ABSOLUTE): 0.1 10*3/uL (ref 0.0–0.4)
Eos: 2 %
Hematocrit: 49 % (ref 37.5–51.0)
Hemoglobin: 16.7 g/dL (ref 13.0–17.7)
Immature Grans (Abs): 0 10*3/uL (ref 0.0–0.1)
Immature Granulocytes: 0 %
Lymphocytes Absolute: 1.6 10*3/uL (ref 0.7–3.1)
Lymphs: 30 %
MCH: 32.6 pg (ref 26.6–33.0)
MCHC: 34.1 g/dL (ref 31.5–35.7)
MCV: 96 fL (ref 79–97)
Monocytes Absolute: 0.4 10*3/uL (ref 0.1–0.9)
Monocytes: 8 %
Neutrophils Absolute: 3 10*3/uL (ref 1.4–7.0)
Neutrophils: 59 %
Platelets: 260 10*3/uL (ref 150–450)
RBC: 5.13 x10E6/uL (ref 4.14–5.80)
RDW: 13.4 % (ref 11.6–15.4)
WBC: 5.2 10*3/uL (ref 3.4–10.8)

## 2023-08-02 LAB — HIV ANTIBODY (ROUTINE TESTING W REFLEX): HIV Screen 4th Generation wRfx: NONREACTIVE

## 2023-08-02 LAB — LIPID PANEL
Chol/HDL Ratio: 3.5 ratio (ref 0.0–5.0)
Cholesterol, Total: 244 mg/dL — ABNORMAL HIGH (ref 100–199)
HDL: 69 mg/dL (ref >39)
LDL Chol Calc (NIH): 157 mg/dL — ABNORMAL HIGH (ref 0–99)
Triglycerides: 101 mg/dL (ref 0–149)
VLDL Cholesterol Cal: 18 mg/dL (ref 5–40)

## 2023-08-02 LAB — PSA: Prostate Specific Ag, Serum: 16 ng/mL — ABNORMAL HIGH (ref 0.0–4.0)

## 2023-08-02 LAB — HEPATITIS C ANTIBODY: Hep C Virus Ab: NONREACTIVE

## 2023-08-02 LAB — TSH: TSH: 1.95 u[IU]/mL (ref 0.450–4.500)

## 2023-08-02 MED ORDER — ROSUVASTATIN CALCIUM 5 MG PO TABS: 5.0000 mg | ORAL_TABLET | Freq: Every day | ORAL | 2 refills | Status: DC

## 2023-08-15 ENCOUNTER — Ambulatory Visit: Payer: BC Managed Care – PPO | Admitting: Urology

## 2023-08-15 ENCOUNTER — Encounter: Payer: Self-pay | Admitting: Urology

## 2023-08-15 VITALS — BP 162/96 | HR 80 | Ht 73.0 in | Wt 167.0 lb

## 2023-08-15 DIAGNOSIS — R972 Elevated prostate specific antigen [PSA]: Secondary | ICD-10-CM

## 2023-08-15 NOTE — Progress Notes (Signed)
   08/15/23 2:27 PM   Lisabeth Devoid January 28, 1961 [id number]  CC: Elevated PSA  HPI: 62 year old male with single elevated PSA of 16 from 08/01/2023.  He reports this was drawn only about an hour after an ejaculation.  He denies any prior PSA values, no family history of prostate or breast cancer.  He denies any urinary symptoms or gross hematuria.   PMH: Past Medical History:  Diagnosis Date   Anxiety    SOB (shortness of breath)     Surgical History: Past Surgical History:  Procedure Laterality Date   HERNIA REPAIR     WRIST FRACTURE SURGERY Left    Family History: Family History  Problem Relation Age of Onset   Stroke Father    Diabetes Father     Social History:  reports that he has been smoking cigarettes. He started smoking about 45 years ago. He has a 11.4 pack-year smoking history. He has been exposed to tobacco smoke. He has never used smokeless tobacco. He reports current alcohol use. He reports that he does not currently use drugs.  Physical Exam: BP (!) 162/96   Pulse 80   Ht 6\' 1"  (1.854 m)   Wt 167 lb (75.8 kg)   BMI 22.03 kg/m    Constitutional:  Alert and oriented, No acute distress. Cardiovascular: No clubbing, cyanosis, or edema. Respiratory: Normal respiratory effort, no increased work of breathing. GI: Abdomen is soft, nontender, nondistended, no abdominal masses DRE: Patient deferred  Laboratory Data: PSA 16  Assessment & Plan:   62 year old male with single isolated PSA value of 16 that was drawn within 1 hour of having an ejaculation.  No family history of prostate or breast cancer, patient deferred DRE today.  We reviewed the implications of an elevated PSA and the uncertainty surrounding it. In general, a man's PSA increases with age and is produced by both normal and cancerous prostate tissue. The differential diagnosis for elevated PSA includes BPH, prostate cancer, infection, recent intercourse/ejaculation, recent urethroscopic  manipulation (foley placement/cystoscopy) or trauma, and prostatitis. Management of an elevated PSA can include observation or prostate biopsy and we discussed this in detail. Our goal is to detect clinically significant prostate cancers, and manage with either active surveillance, surgery, or radiation for localized disease. Risks of prostate biopsy include bleeding, infection (including life threatening sepsis), pain, and lower urinary symptoms. Hematuria, hematospermia, and blood in the stool are all common after biopsy and can persist up to 4 weeks.   -Will repeat PSA with reflex to free within the next 1 to 2 weeks, counseled to avoid ejaculations at least 3 days prior -Call with results.  If remains elevated will schedule prostate biopsy or prostate MRI, if downtrending will follow to normal.  If <4 can continue screening every 2 to 4 years with PCP  Legrand Rams, MD 08/15/2023  [facility] [address] [place], [place] [address] [phone]

## 2023-08-15 NOTE — Patient Instructions (Addendum)
We will recheck your PSA in the next few weeks, and is important to avoid any ejaculations for at least 3 days prior to this blood test, as this can cause a false elevation in the PSA.  If the PSA remains elevated, we would consider either a prostate biopsy or a prostate MRI.  If PSA is decreasing, we will follow it until it is back to normal.  We will call with those results within a few days of having the blood test drawn.   Prostate Cancer Screening  Prostate cancer screening is testing that is done to check for the presence of prostate cancer in men. The prostate gland is a walnut-sized gland that is located below the bladder and in front of the rectum in males. The function of the prostate is to add fluid to semen during ejaculation. Prostate cancer is one of the most common types of cancer in men. Who should have prostate cancer screening? Screening recommendations vary based on age and other risk factors, as well as between the professional organizations who make the recommendations. In general, screening is recommended if: You are age 51 to 75 and have an average risk for prostate cancer. You should talk with your health care provider about your need for screening and how often screening should be done. Because most prostate cancers are slow growing and will not cause death, screening in this age group is generally reserved for men who have a 10- to 15-year life expectancy. You are younger than age 23, and you have these risk factors: Having a father, brother, or uncle who has been diagnosed with prostate cancer. The risk is higher if your family member's cancer occurred at an early age or if you have multiple family members with prostate cancer at an early age. Being a male who is [place] or is of [place] or sub-Saharan African descent. In general, screening is not recommended if: You are younger than age 30. You are between the ages of 57 and 63 and you have no risk factors. You are 63  years of age or older. At this age, the risks that screening can cause are greater than the benefits that it may provide. If you are at high risk for prostate cancer, your health care provider may recommend that you have screenings more often or that you start screening at a younger age. How is screening for prostate cancer done? The recommended prostate cancer screening test is a blood test called the prostate-specific antigen (PSA) test. PSA is a protein that is made in the prostate. As you age, your prostate naturally produces more PSA. Abnormally high PSA levels may be caused by: Prostate cancer. An enlarged prostate that is not caused by cancer (benign prostatic hyperplasia, or BPH). This condition is very common in older men. A prostate gland infection (prostatitis) or urinary tract infection. Certain medicines such as male hormones (like testosterone) or other medicines that raise testosterone levels. A rectal exam may be done as part of prostate cancer screening to help provide information about the size of your prostate gland. When a rectal exam is performed, it should be done after the PSA level is drawn to avoid any effect on the results. Depending on the PSA results, you may need more tests, such as: A physical exam to check the size of your prostate gland, if not done as part of screening. Blood and imaging tests. A procedure to remove tissue samples from your prostate gland for testing (biopsy). This is the  only way to know for certain if you have prostate cancer. What are the benefits of prostate cancer screening? Screening can help to identify cancer at an early stage, before symptoms start and when the cancer can be treated more easily. There is a small chance that screening may lower your risk of dying from prostate cancer. The chance is small because prostate cancer is a slow-growing cancer, and most men with prostate cancer die from a different cause. What are the risks of  prostate cancer screening? The main risk of prostate cancer screening is diagnosing and treating prostate cancer that would never have caused any symptoms or problems. This is called overdiagnosisand overtreatment. PSA screening cannot tell you if your PSA is high due to cancer or a different cause. A prostate biopsy is the only procedure to diagnose prostate cancer. Even the results of a biopsy may not tell you if your cancer needs to be treated. Slow-growing prostate cancer may not need any treatment other than monitoring, so diagnosing and treating it may cause unnecessary stress or other side effects. Questions to ask your health care provider When should I start prostate cancer screening? What is my risk for prostate cancer? How often do I need screening? What type of screening tests do I need? How do I get my test results? What do my results mean? Do I need treatment? Where to find more information The American Cancer Society: www.cancer.org American Urological Association: www.auanet.org Contact a health care provider if: You have difficulty urinating. You have pain when you urinate or ejaculate. You have blood in your urine or semen. You have pain in your back or in the area of your prostate. Summary Prostate cancer is a common type of cancer in men. The prostate gland is located below the bladder and in front of the rectum. This gland adds fluid to semen during ejaculation. Prostate cancer screening may identify cancer at an early stage, when the cancer can be treated more easily and is less likely to have spread to other areas of the body. The prostate-specific antigen (PSA) test is the recommended screening test for prostate cancer, but it has associated risks. Discuss the risks and benefits of prostate cancer screening with your health care provider. If you are age 63 or older, the risks that screening can cause are greater than the benefits that it may provide. This information is  not intended to replace advice given to you by your health care provider. Make sure you discuss any questions you have with your health care provider. Document Revised: 05/24/2021 Document Reviewed: 05/24/2021 Elsevier Patient Education  2024 Elsevier Inc.  Transrectal Ultrasound-Guided Prostate Biopsy A transrectal ultrasound-guided prostate biopsy is a procedure to remove samples of prostate tissue for testing. The prostate is a walnut-sized gland that is located below the bladder and in front of the rectum. During this procedure, a small device (probe) is lubricated and put inside the rectum. The probe sends out sound waves that make a picture of the prostate and surrounding tissues (transrectal ultrasound). The images are used to help guide the process of removing the samples. The samples are taken to a lab to be checked for prostate cancer. This procedure is usually done to evaluate the prostate gland of men who have raised (elevated) levels of prostate-specific antigen (PSA), which can be a sign of prostate cancer or prostate enlargement related to aging (benign prostatic hyperplasia, or BPH). Tell a health care provider about: Any allergies you have. All medicines you are  taking, including vitamins, herbs, eye drops, creams, and over-the-counter medicines. Any problems you or family members have had with anesthetic medicines. Any bleeding problems you have. Any surgeries you have had. Any medical conditions you have. Any prostate infections you have had. What are the risks? Generally, this is a safe procedure. However, problems may occur, including: Prostate infection. Bleeding from the rectum. Blood in the urine. Allergic reactions to medicines. Damage to surrounding structures such as blood vessels, organs, or muscles. Difficulty passing urine. Nerve damage. This is usually temporary. What happens before the procedure? Medicines Ask your health care provider about: Changing or  stopping your regular medicines. This is especially important if you are taking diabetes medicines or blood thinners. Taking medicines such as aspirin and ibuprofen. These medicines can thin your blood. Do not take these medicines unless your health care provider tells you to take them. Taking over-the-counter medicines, vitamins, herbs, and supplements. General instructions Follow instructions from your health care provider about eating and drinking. In most instances, you will not need to stop eating and drinking completely before the procedure. You will be given an enema. During an enema, a liquid is injected into your rectum to clear out waste. You may have a blood or urine sample taken. Ask your health care provider what steps will be taken to help prevent infection. These steps may include: Washing skin with a germ-killing soap. Taking antibiotic medicine. If you will be going home right after the procedure, plan to have a responsible adult: Take you home from the hospital or clinic. You will not be allowed to drive. Care for you for the time you are told. What happens during the procedure?  An IV will be inserted into one of your veins. You will be given one or both of the following: A medicine to help you relax (sedative). A medicine to numb the area (local anesthetic). You will be placed on your left side, and your knees will be bent toward your chest. A probe with lubricated gel will be placed into your rectum, and images will be taken of your prostate and surrounding structures. Numbing medicine will be injected into your prostate. A biopsy needle will be inserted through your rectum or perineum and guided to your prostate using the ultrasound images. Prostate tissue samples will be removed, and the needle and probe will then be removed. The biopsy samples will be sent to a lab to be tested. The procedure may vary among health care providers and hospitals. What happens after the  procedure? Your blood pressure, heart rate, breathing rate, and blood oxygen level will be monitored until you leave the hospital or clinic. You may have some discomfort in the rectal area. You will be given pain medicine as needed. If you were given a sedative during the procedure, it can affect you for several hours. Do not drive or operate machinery until your health care provider says that it is safe. It is up to you to get the results of your procedure. Ask your health care provider, or the department that is doing the procedure, when your results will be ready. Keep all follow-up visits. This is important. Summary A transrectal ultrasound-guided biopsy removes samples of tissue from your prostate using ultrasound-guided sound waves to help guide the process. This procedure is usually done to evaluate the prostate gland of men who have raised (elevated) levels of prostate-specific antigen (PSA), which can be a sign of prostate cancer or prostate enlargement related to aging. After your  procedure, you may feel some discomfort in the rectal area. Plan to have a responsible adult take you home from the hospital or clinic, and follow up with your health care provider for your results. This information is not intended to replace advice given to you by your health care provider. Make sure you discuss any questions you have with your health care provider. Document Revised: 05/24/2021 Document Reviewed: 05/24/2021 Elsevier Patient Education  2024 [employer].

## 2023-08-24 ENCOUNTER — Other Ambulatory Visit
Admission: RE | Admit: 2023-08-24 | Discharge: 2023-08-24 | Disposition: A | Discharge status: Home / Self Care | Payer: BC Managed Care – PPO | Attending: Urology | Admitting: Urology

## 2023-08-24 DIAGNOSIS — R972 Elevated prostate specific antigen [PSA]: Secondary | ICD-10-CM | POA: Diagnosis present

## 2023-08-25 LAB — PSA, TOTAL AND FREE
PSA, Free Pct: 11 %
PSA, Free: 1.28 ng/mL
Prostate Specific Ag, Serum: 11.6 ng/mL — ABNORMAL HIGH (ref 0.0–4.0)

## 2023-08-26 IMAGING — CR DG CHEST 2V
2 series · 2 of 2 positions shown · non-contrast
Comparison: None.

CLINICAL DATA: Jittery feeling for the past few weeks.

EXAM:
CHEST - 2 VIEW

[chest pa]
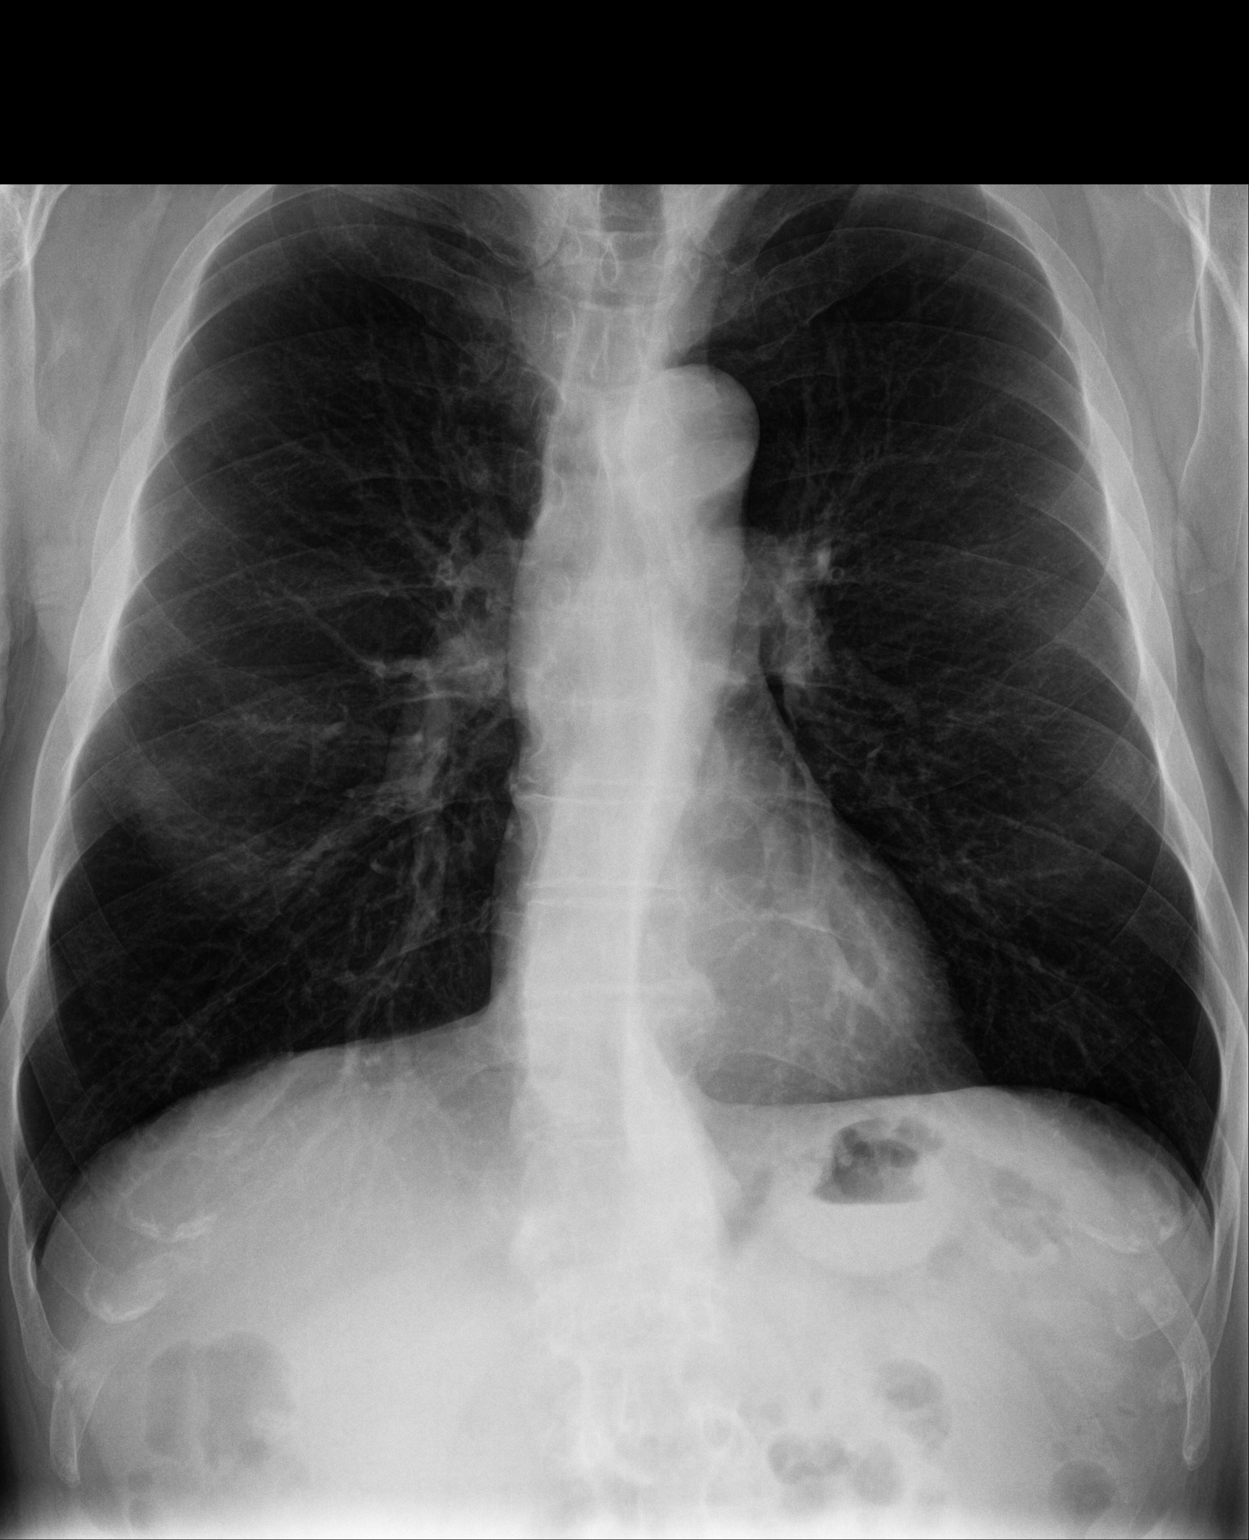

[chest lat]
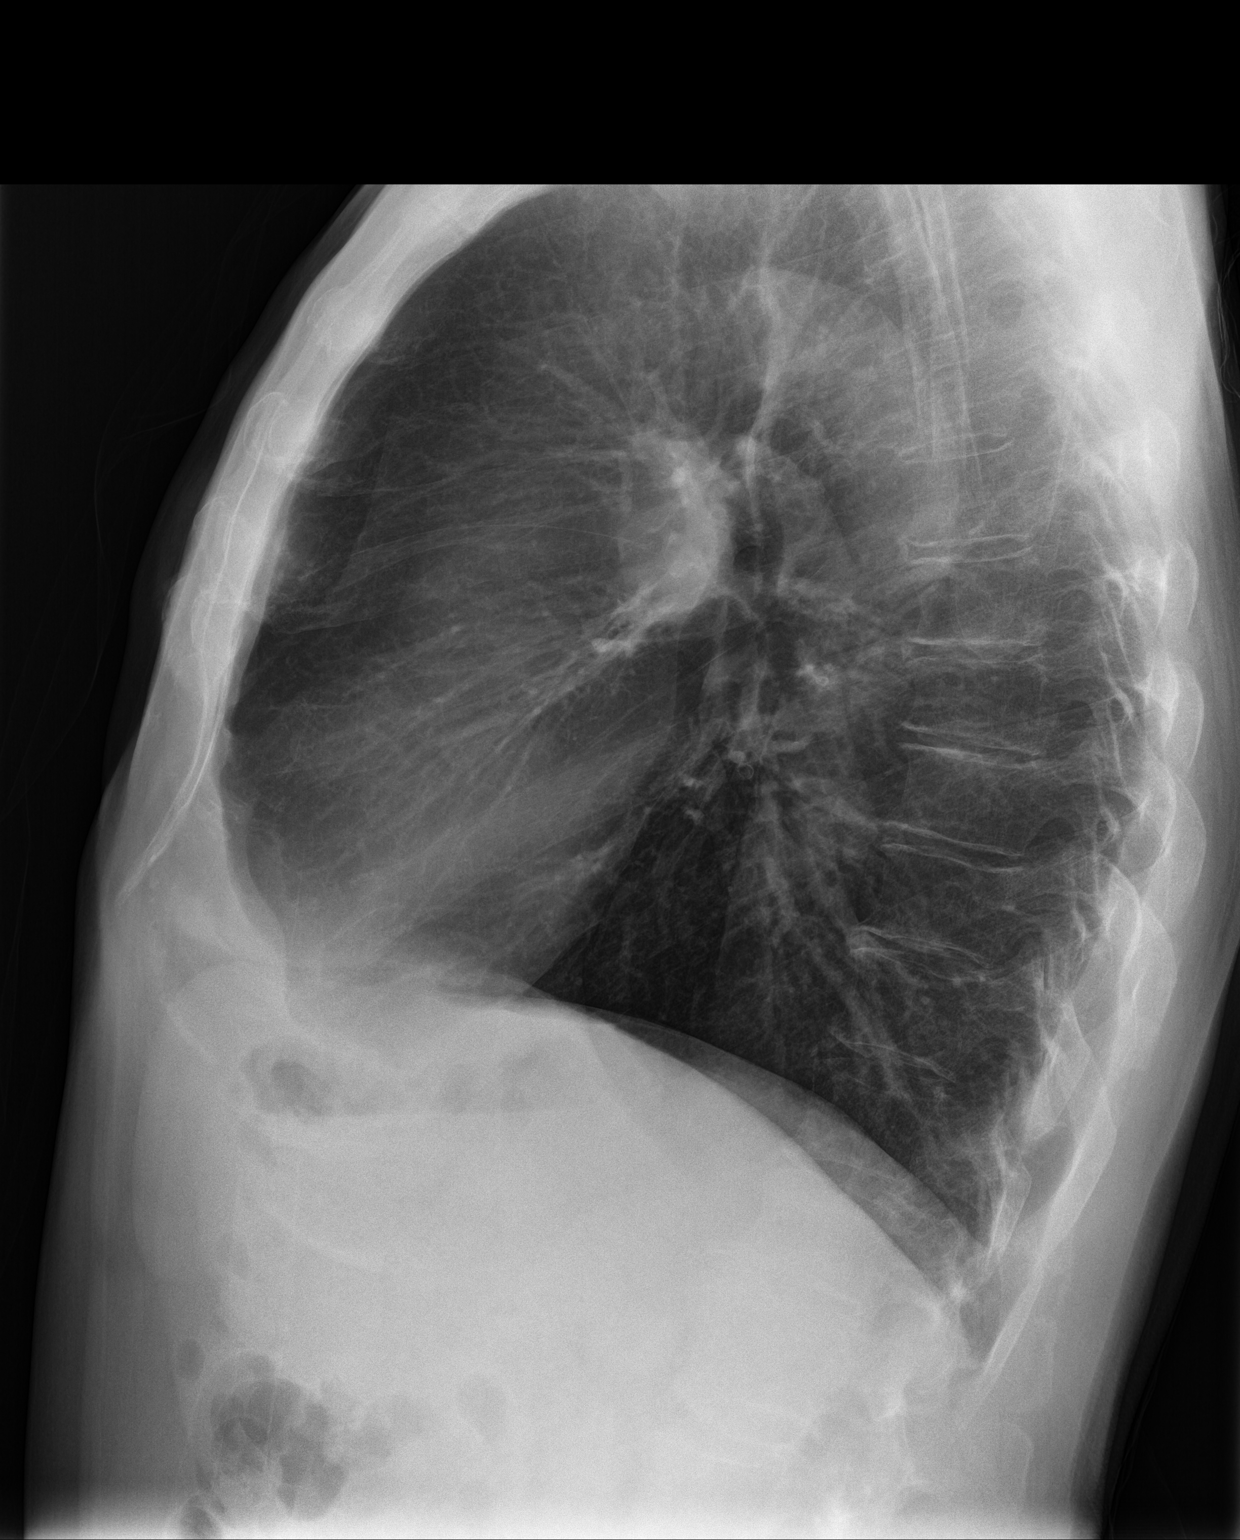

[2 of 2 positions shown; findings below may reference images not displayed]

FINDINGS: The heart size and mediastinal contours are within normal limits.
Normal pulmonary vascularity. The lungs are hyperinflated. No focal
consolidation, pleural effusion, or pneumothorax. No acute osseous
abnormality.
IMPRESSION: 1. No acute cardiopulmonary disease.

## 2023-09-05 ENCOUNTER — Other Ambulatory Visit: Payer: Self-pay

## 2023-09-05 DIAGNOSIS — R972 Elevated prostate specific antigen [PSA]: Secondary | ICD-10-CM

## 2023-09-26 ENCOUNTER — Other Ambulatory Visit: Payer: Self-pay | Admitting: Physician Assistant

## 2023-09-26 MED ORDER — HYDROXYZINE HCL 25 MG PO TABS: 25.0000 mg | ORAL_TABLET | Freq: Three times a day (TID) | ORAL | 3 refills | Status: DC | PRN

## 2023-10-18 ENCOUNTER — Ambulatory Visit: Payer: BC Managed Care – PPO | Admitting: Urology

## 2023-10-23 ENCOUNTER — Other Ambulatory Visit
Admission: RE | Admit: 2023-10-23 | Discharge: 2023-10-23 | Disposition: A | Discharge status: Home / Self Care | Payer: BC Managed Care – PPO | Attending: Urology | Admitting: Urology

## 2023-10-23 DIAGNOSIS — R972 Elevated prostate specific antigen [PSA]: Secondary | ICD-10-CM | POA: Diagnosis present

## 2023-10-24 ENCOUNTER — Encounter: Payer: Self-pay | Admitting: Urology

## 2023-10-24 ENCOUNTER — Ambulatory Visit: Payer: BC Managed Care – PPO | Admitting: Urology

## 2023-10-24 VITALS — BP 153/86 | HR 78 | Ht 73.0 in | Wt 171.6 lb

## 2023-10-24 DIAGNOSIS — R972 Elevated prostate specific antigen [PSA]: Secondary | ICD-10-CM | POA: Diagnosis not present

## 2023-10-24 LAB — PSA, TOTAL AND FREE
PSA, Free Pct: 11.3 %
PSA, Free: 1.2 ng/mL
Prostate Specific Ag, Serum: 10.6 ng/mL — ABNORMAL HIGH (ref 0.0–4.0)

## 2023-10-24 NOTE — Patient Instructions (Signed)
The Benefits of a Plant-Based Diet for Urology Health  A plant-based diet emphasizes the consumption of whole, unprocessed plant foods while minimizing or excluding animal products including meat and dairy products. This dietary approach has gained attention for its potential to promote overall health, including urology-related conditions. Incorporating a plant-based diet into your lifestyle can offer numerous benefits for maintaining optimal urology health.  1. Reduced Risk of Kidney Stones: A plant-based diet is typically rich in fruits, vegetables, legumes, and whole grains. These foods are high in dietary fiber, potassium, and magnesium, which can help reduce the risk of developing kidney stones. Be careful to avoid high quantities of spinach, as these can contribute to kidney stone formation if eaten in large volumes. The increased intake of water-soluble fiber can enhance the excretion of waste products and prevent the crystallization of minerals that lead to stone formation.  2. Improved Prostate Health: Studies have suggested a link between the consumption of red and processed meats and an increased risk of prostate problems, including benign prostatic hyperplasia (BPH) and prostate cancer. By adopting a plant-based diet, you can lower your intake of saturated fats and decrease the risk of these conditions. PSA levels can often decrease on plant based diets! Plant foods are also rich in antioxidants and phytochemicals that have been associated with prostate health.  3. Better Bladder Function: A diet focused on plant-based foods can contribute to better bladder health by reducing the risk of urinary tract infections (UTIs). Berries, citrus fruits, and leafy greens are known for their high vitamin C content, which can acidify urine and create an environment less favorable for bacteria growth. Additionally, plant-based diets are generally lower in sodium, which can help prevent fluid retention and  reduce the strain on the bladder.  4. Management of Erectile Dysfunction (ED): Some research suggests that a plant-based diet can positively impact erectile function. Plant-based diets are associated with improved cardiovascular health, which is crucial for maintaining healthy blood flow and nerve function required for proper erectile function. By reducing the consumption of high-cholesterol and high-saturated fat animal products, a plant-based diet may contribute to a decreased risk of ED.  5. Prevention of Chronic Conditions: A plant-based diet can help prevent or manage chronic conditions such as obesity, diabetes, and hypertension. These conditions can contribute to urology-related issues, including urinary incontinence and kidney dysfunction. By maintaining a healthy weight and managing these conditions, you can reduce the risk of urology-related complications.  Conclusion: Embracing a plant-based diet can offer significant benefits for urology health. By incorporating a variety of colorful fruits, vegetables, whole grains, nuts, seeds, and legumes into your meals, you can support kidney health, prostate health, bladder function, and overall well-being. Remember to consult with a healthcare professional or registered dietitian before making any significant dietary changes, especially if you have existing health conditions. Your personalized approach to a plant-based diet can contribute to improved urology health and enhance your quality of life.      Prostate Cancer Screening  Prostate cancer screening is testing that is done to check for the presence of prostate cancer in men. The prostate gland is a walnut-sized gland that is located below the bladder and in front of the rectum in males. The function of the prostate is to add fluid to semen during ejaculation. Prostate cancer is one of the most common types of cancer in men. Who should have prostate cancer screening? Screening  recommendations vary based on age and other risk factors, as well as between the professional  organizations who make the recommendations. In general, screening is recommended if: You are age 63 to 97 and have an average risk for prostate cancer. You should talk with your health care provider about your need for screening and how often screening should be done. Because most prostate cancers are slow growing and will not cause death, screening in this age group is generally reserved for men who have a 10- to 15-year life expectancy. You are younger than age 63, and you have these risk factors: Having a father, brother, or uncle who has been diagnosed with prostate cancer. The risk is higher if your family member's cancer occurred at an early age or if you have multiple family members with prostate cancer at an early age. Being a male who is [place] or is of [place] or sub-Saharan African descent. In general, screening is not recommended if: You are younger than age 63. You are between the ages of 41 and 45 and you have no risk factors. You are 63 years of age or older. At this age, the risks that screening can cause are greater than the benefits that it may provide. If you are at high risk for prostate cancer, your health care provider may recommend that you have screenings more often or that you start screening at a younger age. How is screening for prostate cancer done? The recommended prostate cancer screening test is a blood test called the prostate-specific antigen (PSA) test. PSA is a protein that is made in the prostate. As you age, your prostate naturally produces more PSA. Abnormally high PSA levels may be caused by: Prostate cancer. An enlarged prostate that is not caused by cancer (benign prostatic hyperplasia, or BPH). This condition is very common in older men. A prostate gland infection (prostatitis) or urinary tract infection. Certain medicines such as male hormones (like testosterone)  or other medicines that raise testosterone levels. A rectal exam may be done as part of prostate cancer screening to help provide information about the size of your prostate gland. When a rectal exam is performed, it should be done after the PSA level is drawn to avoid any effect on the results. Depending on the PSA results, you may need more tests, such as: A physical exam to check the size of your prostate gland, if not done as part of screening. Blood and imaging tests. A procedure to remove tissue samples from your prostate gland for testing (biopsy). This is the only way to know for certain if you have prostate cancer. What are the benefits of prostate cancer screening? Screening can help to identify cancer at an early stage, before symptoms start and when the cancer can be treated more easily. There is a small chance that screening may lower your risk of dying from prostate cancer. The chance is small because prostate cancer is a slow-growing cancer, and most men with prostate cancer die from a different cause. What are the risks of prostate cancer screening? The main risk of prostate cancer screening is diagnosing and treating prostate cancer that would never have caused any symptoms or problems. This is called overdiagnosisand overtreatment. PSA screening cannot tell you if your PSA is high due to cancer or a different cause. A prostate biopsy is the only procedure to diagnose prostate cancer. Even the results of a biopsy may not tell you if your cancer needs to be treated. Slow-growing prostate cancer may not need any treatment other than monitoring, so diagnosing and treating it may cause unnecessary stress  or other side effects. Questions to ask your health care provider When should I start prostate cancer screening? What is my risk for prostate cancer? How often do I need screening? What type of screening tests do I need? How do I get my test results? What do my results mean? Do I need  treatment? Where to find more information The American Cancer Society: www.cancer.org American Urological Association: www.auanet.org Contact a health care provider if: You have difficulty urinating. You have pain when you urinate or ejaculate. You have blood in your urine or semen. You have pain in your back or in the area of your prostate. Summary Prostate cancer is a common type of cancer in men. The prostate gland is located below the bladder and in front of the rectum. This gland adds fluid to semen during ejaculation. Prostate cancer screening may identify cancer at an early stage, when the cancer can be treated more easily and is less likely to have spread to other areas of the body. The prostate-specific antigen (PSA) test is the recommended screening test for prostate cancer, but it has associated risks. Discuss the risks and benefits of prostate cancer screening with your health care provider. If you are age 9 or older, the risks that screening can cause are greater than the benefits that it may provide. This information is not intended to replace advice given to you by your health care provider. Make sure you discuss any questions you have with your health care provider. Document Revised: 05/24/2021 Document Reviewed: 05/24/2021 Elsevier Patient Education  2024 [employer].

## 2023-10-24 NOTE — Progress Notes (Signed)
   10/24/2023 3:03 PM   William Garrett 1961/08/05 [id number]  Reason for visit: Follow up elevated PSA  HPI: 62 year old male who originally presented with an elevated PSA of 16 from August 2024.  He reported this was drawn about an hour after an ejaculation and opted for repeat PSA.  He deferred DRE.  Repeat PSA 08/24/2023 decreased to 11.6, I recommended trending the PSA back to normal.  Most recent PSA from 10/23/2023 was relatively stable but slightly decreased to 10.6.  We again discussed options at length including repeat PSA in 8 to 10 weeks, prostate MRI, or prostate biopsy.  Risks and benefits were reviewed extensively, he is very averse to biopsy or MRI unless necessary and would like to repeat the PSA.  I stressed at length that if PSA remains greater than 10 would strongly recommend biopsy or MRI.  Finally, we also reviewed 4K score as a potential option and he deferred.  Lab visit PSA reflex to free in 8 weeks, call with results.  Will pursue prostate biopsy if remains significantly elevated   Sondra Come, MD  [facility] [address], [address] [place], [place] [address] [phone]

## 2023-10-29 ENCOUNTER — Other Ambulatory Visit: Payer: Self-pay | Admitting: Physician Assistant

## 2023-10-29 DIAGNOSIS — E7800 Pure hypercholesterolemia, unspecified: Secondary | ICD-10-CM

## 2023-10-30 NOTE — Telephone Encounter (Signed)
Requested Prescriptions  Pending Prescriptions Disp Refills   rosuvastatin (CRESTOR) 5 MG tablet [Pharmacy Med Name: ROSUVASTATIN CALCIUM 5 MG TAB] 90 tablet 2    Sig: TAKE 1 TABLET (5 MG TOTAL) BY MOUTH DAILY.     Cardiovascular:  Antilipid - Statins 2 Failed - 10/29/2023  8:41 AM      Failed - Cr in normal range and within 360 days    Creatinine, Ser  Date Value Ref Range Status  08/01/2023 0.75 (L) 0.76 - 1.27 mg/dL Final         Failed - Lipid Panel in normal range within the last 12 months    Cholesterol, Total  Date Value Ref Range Status  08/01/2023 244 (H) 100 - 199 mg/dL Final   LDL Chol Calc (NIH)  Date Value Ref Range Status  08/01/2023 157 (H) 0 - 99 mg/dL Final   HDL  Date Value Ref Range Status  08/01/2023 69 >39 mg/dL Final   Triglycerides  Date Value Ref Range Status  08/01/2023 101 0 - 149 mg/dL Final         Passed - Patient is not pregnant      Passed - Valid encounter within last 12 months    Recent Outpatient Visits           3 months ago Annual physical exam   [facility] at [facility], Melton Alar, [place]       Future Appointments             In 9 months Mordecai Maes, Melton Alar, PA [facility] Primary Care & [facility] Medicine at [facility], [facility]

## 2023-11-21 ENCOUNTER — Telehealth: Payer: Self-pay

## 2023-11-21 NOTE — Telephone Encounter (Signed)
Pt came into the office asking for a Albuterol refill. Pt states he has been trying to get a refill for a while.

## 2023-11-21 NOTE — Telephone Encounter (Signed)
Pt stated that he did not feel like he needed to use it today but wanted it just in case because I makes hm feel safer knowing he has it. Pts inhaler stated it had 24 puffs in it. Pt stated it did not work. Ask patient if he minded if I pressed the inhaler. Confirmed that it did not work.  KP

## 2023-11-22 ENCOUNTER — Other Ambulatory Visit: Payer: Self-pay | Admitting: Physician Assistant

## 2023-11-22 MED ORDER — ALBUTEROL SULFATE HFA 108 (90 BASE) MCG/ACT IN AERS: 2.0000 | INHALATION_SPRAY | RESPIRATORY_TRACT | 11 refills | Status: DC | PRN

## 2023-12-18 ENCOUNTER — Other Ambulatory Visit: Payer: Self-pay | Admitting: Physician Assistant

## 2023-12-18 NOTE — Telephone Encounter (Signed)
 Medication Refill -  Most Recent Primary Care Visit:  Provider: MANYA TORIBIO SQUIBB  Department: [facility]  Visit Type: NEW PATIENT  Date: 08/01/2023  Medication: Hydroxyzine  25 mg  Has the patient contacted their pharmacy? No (Agent: If no, request that the patient contact the pharmacy for the refill. If patient does not wish to contact the pharmacy document the reason why and proceed with request.) (Agent: If yes, when and what did the pharmacy advise?)  Is this the correct pharmacy for this prescription? Yes If no, delete pharmacy and type the correct one.  This is the patient's preferred pharmacy:  [facility] [phone] [place], [place] - [address] [place] [address] Phone: [phone] Fax: [phone]   Has the prescription been filled recently? Yes  Is the patient out of the medication? Yes  Has the patient been seen for an appointment in the last year OR does the patient have an upcoming appointment? Yes  Can we respond through MyChart? No  Agent: Please be advised that Rx refills may take up to 3 business days. We ask that you follow-up with your pharmacy.

## 2023-12-20 ENCOUNTER — Other Ambulatory Visit
Admission: RE | Admit: 2023-12-20 | Discharge: 2023-12-20 | Disposition: A | Discharge status: Home / Self Care | Payer: 59 | Attending: Urology | Admitting: Urology

## 2023-12-20 DIAGNOSIS — R972 Elevated prostate specific antigen [PSA]: Secondary | ICD-10-CM | POA: Insufficient documentation

## 2023-12-21 LAB — PSA, TOTAL AND FREE
PSA, Free Pct: 10.4 %
PSA, Free: 1.64 ng/mL
Prostate Specific Ag, Serum: 15.8 ng/mL — ABNORMAL HIGH (ref 0.0–4.0)

## 2023-12-21 MED ORDER — HYDROXYZINE HCL 25 MG PO TABS: 25.0000 mg | ORAL_TABLET | Freq: Three times a day (TID) | ORAL | 3 refills | Status: AC | PRN

## 2023-12-21 NOTE — Telephone Encounter (Signed)
 Requested Prescriptions  Pending Prescriptions Disp Refills   hydrOXYzine  (ATARAX ) 25 MG tablet 30 tablet 3    Sig: Take 1 tablet (25 mg total) by mouth 3 (three) times daily as needed for anxiety.     Ear, Nose, and Throat:  Antihistamines 2 Failed - 12/21/2023  8:13 AM      Failed - Cr in normal range and within 360 days    Creatinine, Ser  Date Value Ref Range Status  08/01/2023 0.75 (L) 0.76 - 1.27 mg/dL Final         Passed - Valid encounter within last 12 months    Recent Outpatient Visits           4 months ago Annual physical exam   [facility] Primary Care & Sports Medicine at [facility], Toribio SQUIBB, [place]       Future Appointments             In 7 months Manya, Toribio SQUIBB, PA [facility] Primary Care & Sports Medicine at [facility], [facility]

## 2023-12-22 ENCOUNTER — Telehealth: Payer: Self-pay

## 2023-12-22 NOTE — Telephone Encounter (Signed)
 Called pt informed him of the information below. Pt voiced understanding and states he has viewed results via mychart. Described the prostate biopsy procedure in detail to patient per his request. Pt states that he would like time to think about proceeding with biopsy before scheduling. Offered pt appt with provider to discuss, pt declined. He states he will call us  when/if he is ready.

## 2023-12-22 NOTE — Telephone Encounter (Signed)
-----   Message from Redell JAYSON Burnet sent at 12/22/2023  7:35 AM EST ----- PSA remains significantly elevated at 15.8.  I would strongly recommend biopsy as we discussed in clinic at this point.  Please review prostate biopsy instructions and schedule.  Okay to schedule appointment or virtual visit if he would like to discuss further  Redell Burnet, MD 12/22/2023

## 2024-08-02 ENCOUNTER — Ambulatory Visit (INDEPENDENT_AMBULATORY_CARE_PROVIDER_SITE_OTHER): Payer: Self-pay | Admitting: Physician Assistant

## 2024-08-02 VITALS — BP 138/82 | HR 74 | Temp 97.9°F | Ht 73.0 in | Wt 170.0 lb

## 2024-08-02 DIAGNOSIS — F17200 Nicotine dependence, unspecified, uncomplicated: Secondary | ICD-10-CM

## 2024-08-02 DIAGNOSIS — R972 Elevated prostate specific antigen [PSA]: Secondary | ICD-10-CM

## 2024-08-02 DIAGNOSIS — E7800 Pure hypercholesterolemia, unspecified: Secondary | ICD-10-CM

## 2024-08-02 DIAGNOSIS — J410 Simple chronic bronchitis: Secondary | ICD-10-CM

## 2024-08-02 DIAGNOSIS — Z0000 Encounter for general adult medical examination without abnormal findings: Secondary | ICD-10-CM | POA: Diagnosis not present

## 2024-08-02 DIAGNOSIS — Z1211 Encounter for screening for malignant neoplasm of colon: Secondary | ICD-10-CM | POA: Diagnosis not present

## 2024-08-02 DIAGNOSIS — Z2821 Immunization not carried out because of patient refusal: Secondary | ICD-10-CM | POA: Diagnosis not present

## 2024-08-02 MED ORDER — PRAVASTATIN SODIUM 10 MG PO TABS: 10.0000 mg | ORAL_TABLET | Freq: Every day | ORAL | 0 refills | Status: DC

## 2024-08-02 NOTE — Progress Notes (Signed)
 Date:  08/02/2024   Name:  William Garrett   DOB:  December 25, 1960   MRN:  [id number]   Chief Complaint: Annual Exam (Does not want any vaccines)  HPI William Garrett is a very pleasant 63 y.o. male with a history of cigarette smoking, elevated PSA, HLD, and probable COPD.  Prior to his physical with me last year, he had been lost to primary care for decades.  Naturally he is overdue for all cancer screenings.  Cigarette smoking has been light (less than 5 cigarettes/day) but prolonged ~45 years duration.  He is working on quitting gradually.  Last year he had moderate HLD with LDL 157 in the context of significant smoking history, so we started him on rosuvastatin  5 mg.  Unfortunately he only took it for about a month, stopped due to side effect achy, never returned for his 65m follow-up  Ordered Cologuard for him (refused colonoscopy), but he did not complete it and the order expired.  PSA 08/01/2023 was 16.0 prompting referral to urology with subsequent readings 11.6 (Sept), 10.6 (Nov), and 15.8 (Jan). Bx was strongly recommended but declined.   Last Dental Exam: >4y ago Last Eye Exam: 3wk ago Last CRC screen: never Last PSA: 15.20 Dec 2023 Last LDCT: Never Imm Due: Prevnar 20, Shingrix, RSV   Medication list has been reviewed and updated.  Current Meds  Medication Sig   albuterol  (VENTOLIN  HFA) 108 (90 Base) MCG/ACT inhaler Inhale 2 puffs into the lungs every 4 (four) hours as needed for wheezing or shortness of breath.   hydrOXYzine  (ATARAX ) 25 MG tablet Take 1 tablet (25 mg total) by mouth 3 (three) times daily as needed for anxiety.   pravastatin  (PRAVACHOL ) 10 MG tablet Take 1 tablet (10 mg total) by mouth daily.     Review of Systems  Patient Active Problem List   Diagnosis Date Noted   Elevated PSA, between 10 and less than 20 ng/ml 08/02/2023   Pure hypercholesterolemia 08/02/2023   Smokers' cough (HCC) 08/01/2023   Tobacco use disorder 08/01/2023    No Known  Allergies  Immunization History  Administered Date(s) Administered   Tdap 08/01/2023    Past Surgical History:  Procedure Laterality Date   HERNIA REPAIR     WRIST FRACTURE SURGERY Left     Social History   Tobacco Use   Smoking status: Every Day    Current packs/day: 0.25    Average packs/day: 0.3 packs/day for 46.6 years (11.7 ttl pk-yrs)    Types: Cigarettes    Start date: 1979    Passive exposure: Current   Smokeless tobacco: Never  Vaping Use   Vaping status: Never Used  Substance Use Topics   Alcohol use: Yes    Alcohol/week: 22.0 standard drinks of alcohol    Types: 12 Cans of beer, 10 Shots of liquor per week    Comment: occasionally   Drug use: Not Currently    Family History  Problem Relation Age of Onset   Stroke Father    Diabetes Father         08/02/2024    8:30 AM 08/01/2023    8:38 AM  GAD 7 : Generalized Anxiety Score  Nervous, Anxious, on Edge 0 0  Control/stop worrying 0 0  Worry too much - different things 0 0  Trouble relaxing 0 0  Restless 0 0  Easily annoyed or irritable 0 0  Afraid - awful might happen 0 0  Total GAD 7 Score 0 0  Anxiety Difficulty Not difficult at all Not difficult at all       08/02/2024    8:29 AM 08/01/2023    8:38 AM  Depression screen PHQ 2/9  Decreased Interest 0 0  Down, Depressed, Hopeless 0 0  PHQ - 2 Score 0 0  Altered sleeping  0  Tired, decreased energy  0  Change in appetite  0  Feeling bad or failure about yourself   0  Trouble concentrating  0  Moving slowly or fidgety/restless  0  Suicidal thoughts  0  PHQ-9 Score  0  Difficult doing work/chores  Not difficult at all    BP Readings from Last 3 Encounters:  08/02/24 138/82  10/24/23 (!) 153/86  08/15/23 (!) 162/96    Wt Readings from Last 3 Encounters:  08/02/24 170 lb (77.1 kg)  10/24/23 171 lb 9.6 oz (77.8 kg)  08/15/23 167 lb (75.8 kg)    BP 138/82   Pulse 74   Temp 97.9 F (36.6 C)   Ht 6' 1 (1.854 m)   Wt 170 lb (77.1  kg)   SpO2 95%   BMI 22.43 kg/m   Physical Exam Vitals and nursing note reviewed.  Constitutional:      Appearance: Normal appearance.  HENT:     Ears:     Comments: EAC clear bilaterally with good view of TM which is without effusion or erythema.     Nose: Nose normal.     Mouth/Throat:     Mouth: Mucous membranes are moist. No oral lesions.     Dentition: Normal dentition.     Pharynx: No posterior oropharyngeal erythema.  Eyes:     Extraocular Movements: Extraocular movements intact.     Conjunctiva/sclera: Conjunctivae normal.     Pupils: Pupils are equal, round, and reactive to light.  Neck:     Thyroid : No thyromegaly.  Cardiovascular:     Rate and Rhythm: Normal rate and regular rhythm.     Heart sounds: No murmur heard.    No friction rub. No gallop.     Comments: Pulses 2+ at radial, PT, DP bilaterally. No carotid bruit. No peripheral edema Pulmonary:     Effort: Pulmonary effort is normal.     Breath sounds: Decreased breath sounds and wheezing present. No rhonchi or rales.  Abdominal:     General: Bowel sounds are normal.     Palpations: Abdomen is soft. There is no mass.     Tenderness: There is no abdominal tenderness.  Genitourinary:    Comments: Genital/rectal exam deferred. Reviewed technique for testicular self-exam. Musculoskeletal:     Comments: Full ROM with strength 5/5 bilateral upper and lower extremities  Lymphadenopathy:     Cervical: No cervical adenopathy.  Skin:    General: Skin is warm.     Capillary Refill: Capillary refill takes less than 2 seconds.     Findings: No lesion or rash.  Neurological:     Mental Status: He is alert and oriented to person, place, and time.     Gait: Gait is intact.  Psychiatric:        Mood and Affect: Mood normal.        Behavior: Behavior normal.     Recent Labs     Component Value Date/Time   NA 136 08/01/2023 0946   K 4.7 08/01/2023 0946   CL 97 08/01/2023 0946   CO2 26 08/01/2023 0946    GLUCOSE 103 (H) 08/01/2023 0946   GLUCOSE  100 (H) 12/17/2021 1435   BUN 12 08/01/2023 0946   CREATININE 0.75 (L) 08/01/2023 0946   CALCIUM  9.3 08/01/2023 0946   PROT 7.4 08/01/2023 0946   ALBUMIN 4.4 08/01/2023 0946   AST 17 08/01/2023 0946   ALT 16 08/01/2023 0946   ALKPHOS 88 08/01/2023 0946   BILITOT 0.6 08/01/2023 0946   GFRNONAA >60 12/17/2021 1435    Lab Results  Component Value Date   WBC 5.2 08/01/2023   HGB 16.7 08/01/2023   HCT 49.0 08/01/2023   MCV 96 08/01/2023   PLT 260 08/01/2023   No results found for: HGBA1C Lab Results  Component Value Date   CHOL 244 (H) 08/01/2023   HDL 69 08/01/2023   LDLCALC 157 (H) 08/01/2023   TRIG 101 08/01/2023   CHOLHDL 3.5 08/01/2023   Lab Results  Component Value Date   TSH 1.950 08/01/2023      Assessment and Plan:  1. Annual physical exam (Primary) Encouraged healthy lifestyle including regular physical activity and consumption of whole fruits and vegetables. Encouraged routine dental and eye exams. Check routine labs.   - Lipid panel - PSA - CBC with Differential/Platelet - Comprehensive metabolic panel with GFR - TSH  2. Screening for colon cancer Discussed with patient last year's order expired. Ordering another Cologuard. He says he will complete this one.  - Cologuard  3. Elevated PSA, between 10 and less than 20 ng/ml Strongly encouraged prostate exam, but declined.  Discussed urology's recommendation for prostate biopsy, and I would agree with this recommendation.  Patient will think about it.  In the meantime check PSA. - PSA  4. Immunization declined Declines Prevnar 20, Shingrix, RSV today.  Discussed that if I had to choose 1, I would certainly recommend Prevnar 20 for him given his smoking history and probable COPD putting him at increased risk for severe pneumonia.  He will put some additional thought into this.  5. Pure hypercholesterolemia Check fasting lipids.  Did not tolerate rosuvastatin .   Will try low-dose pravastatin , and if still not tolerated, plan to switch to Zetia instead.   The 10-year ASCVD risk score (Arnett DK, et al., 2019) is: 17.1%  - Lipid panel - pravastatin  (PRAVACHOL ) 10 MG tablet; Take 1 tablet (10 mg total) by mouth daily.  Dispense: 30 tablet; Refill: 0  6. Tobacco use disorder Ongoing, low use over many years.  Encouraged cessation  7. Smokers' cough [facility]) Patient aware of probable COPD.  Uses albuterol  as needed, sometimes goes a week without needing it, depends on level of exertion.  Patient aware that the best treatment for this is smoking cessation.     Return in about 1 year (around 08/02/2025) for CPE.  Patient to update me via MyChart regarding pravastatin  tolerance. Plan for repeat fasting lipids in 3-6 months.   Rolan Hoyle, PA-C, DMSc, Nutritionist [facility] Primary Care and Sports Medicine [facility] [phone]

## 2024-08-02 NOTE — Patient Instructions (Signed)
-  It was a pleasure to see you today! Please review your visit summary for helpful information -Lab results are usually available within 1-2 days and we will call once reviewed -I would encourage you to follow your care via MyChart where you can access lab results, notes, messages, and more -If you feel that we did a nice job today, please complete your after-visit survey and leave us  a Google review! Your CMA today was Kieandra and your provider was Rolan Hoyle, PA-C, DMSc -Please return for follow-up in about 1 year, perhaps sooner to recheck cholesterol

## 2024-08-03 LAB — CBC WITH DIFFERENTIAL/PLATELET
Basophils Absolute: 0.1 x10E3/uL (ref 0.0–0.2)
Basos: 2 %
EOS (ABSOLUTE): 0.2 x10E3/uL (ref 0.0–0.4)
Eos: 4 %
Hematocrit: 47.6 % (ref 37.5–51.0)
Hemoglobin: 16.2 g/dL (ref 13.0–17.7)
Immature Grans (Abs): 0 x10E3/uL (ref 0.0–0.1)
Immature Granulocytes: 0 %
Lymphocytes Absolute: 1.7 x10E3/uL (ref 0.7–3.1)
Lymphs: 34 %
MCH: 33.5 pg — ABNORMAL HIGH (ref 26.6–33.0)
MCHC: 34 g/dL (ref 31.5–35.7)
MCV: 98 fL — ABNORMAL HIGH (ref 79–97)
Monocytes Absolute: 0.4 x10E3/uL (ref 0.1–0.9)
Monocytes: 8 %
Neutrophils Absolute: 2.6 x10E3/uL (ref 1.4–7.0)
Neutrophils: 52 %
Platelets: 251 x10E3/uL (ref 150–450)
RBC: 4.84 x10E6/uL (ref 4.14–5.80)
RDW: 13.4 % (ref 11.6–15.4)
WBC: 4.9 x10E3/uL (ref 3.4–10.8)

## 2024-08-03 LAB — COMPREHENSIVE METABOLIC PANEL WITH GFR
ALT: 15 IU/L (ref 0–44)
AST: 16 IU/L (ref 0–40)
Albumin: 4.3 g/dL (ref 3.9–4.9)
Alkaline Phosphatase: 77 IU/L (ref 44–121)
BUN/Creatinine Ratio: 16 (ref 10–24)
BUN: 12 mg/dL (ref 8–27)
Bilirubin Total: 0.4 mg/dL (ref 0.0–1.2)
CO2: 25 mmol/L (ref 20–29)
Calcium: 9.2 mg/dL (ref 8.6–10.2)
Chloride: 97 mmol/L (ref 96–106)
Creatinine, Ser: 0.75 mg/dL — ABNORMAL LOW (ref 0.76–1.27)
Globulin, Total: 2.9 g/dL (ref 1.5–4.5)
Glucose: 93 mg/dL (ref 70–99)
Potassium: 5.1 mmol/L (ref 3.5–5.2)
Sodium: 136 mmol/L (ref 134–144)
Total Protein: 7.2 g/dL (ref 6.0–8.5)
eGFR: 101 mL/min/1.73 (ref >59)

## 2024-08-03 LAB — LIPID PANEL
Chol/HDL Ratio: 3.3 ratio (ref 0.0–5.0)
Cholesterol, Total: 247 mg/dL — ABNORMAL HIGH (ref 100–199)
HDL: 75 mg/dL (ref >39)
LDL Chol Calc (NIH): 161 mg/dL — ABNORMAL HIGH (ref 0–99)
Triglycerides: 67 mg/dL (ref 0–149)
VLDL Cholesterol Cal: 11 mg/dL (ref 5–40)

## 2024-08-03 LAB — PSA: Prostate Specific Ag, Serum: 21.2 ng/mL — ABNORMAL HIGH (ref 0.0–4.0)

## 2024-08-03 LAB — TSH: TSH: 1.4 u[IU]/mL (ref 0.450–4.500)

## 2024-08-05 ENCOUNTER — Ambulatory Visit: Payer: Self-pay | Admitting: Physician Assistant

## 2024-08-05 DIAGNOSIS — R972 Elevated prostate specific antigen [PSA]: Secondary | ICD-10-CM

## 2024-08-05 DIAGNOSIS — R195 Other fecal abnormalities: Secondary | ICD-10-CM

## 2024-08-06 NOTE — Telephone Encounter (Signed)
 Please review.  KP

## 2024-08-06 NOTE — Telephone Encounter (Signed)
 Prostate MRI ordered, number to radiology scheduling given to patient in addition to prostate MRI prep instructions.

## 2024-08-21 LAB — COLOGUARD: COLOGUARD: POSITIVE — AB

## 2024-08-22 ENCOUNTER — Ambulatory Visit
Admission: RE | Admit: 2024-08-22 | Discharge: 2024-08-22 | Disposition: A | Discharge status: Home / Self Care | Source: Ambulatory Visit | Attending: Urology | Admitting: Urology

## 2024-08-22 DIAGNOSIS — R972 Elevated prostate specific antigen [PSA]: Secondary | ICD-10-CM | POA: Diagnosis present

## 2024-08-22 DIAGNOSIS — R195 Other fecal abnormalities: Secondary | ICD-10-CM | POA: Insufficient documentation

## 2024-08-22 MED ORDER — GADOBUTROL 1 MMOL/ML IV SOLN
7.5000 mL | Freq: Once | INTRAVENOUS | Status: AC | PRN
  Administered 2024-08-22: 7.5 mL via INTRAVENOUS

## 2024-08-27 ENCOUNTER — Ambulatory Visit: Payer: Self-pay | Admitting: Urology

## 2024-08-27 ENCOUNTER — Other Ambulatory Visit: Payer: Self-pay | Admitting: Physician Assistant

## 2024-08-27 DIAGNOSIS — E7800 Pure hypercholesterolemia, unspecified: Secondary | ICD-10-CM

## 2024-08-27 NOTE — Telephone Encounter (Signed)
 Called pt no answer. Left detailed message per DPR. Instructions also sent via mychart.

## 2024-08-28 NOTE — Telephone Encounter (Signed)
 Requested Prescriptions  Pending Prescriptions Disp Refills   pravastatin  (PRAVACHOL ) 10 MG tablet [Pharmacy Med Name: PRAVASTATIN  SODIUM 10 MG TAB] 90 tablet 3    Sig: TAKE 1 TABLET BY MOUTH EVERY DAY     Cardiovascular:  Antilipid - Statins Failed - 08/28/2024  3:30 PM      Failed - Lipid Panel in normal range within the last 12 months    Cholesterol, Total  Date Value Ref Range Status  08/02/2024 247 (H) 100 - 199 mg/dL Final   LDL Chol Calc (NIH)  Date Value Ref Range Status  08/02/2024 161 (H) 0 - 99 mg/dL Final   HDL  Date Value Ref Range Status  08/02/2024 75 >39 mg/dL Final   Triglycerides  Date Value Ref Range Status  08/02/2024 67 0 - 149 mg/dL Final         Passed - Patient is not pregnant      Passed - Valid encounter within last 12 months    Recent Outpatient Visits           3 weeks ago Annual physical exam   [facility] Primary Care & Sports Medicine at [facility], William SQUIBB, William Garrett       Future Appointments             In 1 month William Garrett, William BROCKS, William Garrett [facility] [place]

## 2024-08-29 ENCOUNTER — Telehealth: Payer: Self-pay | Admitting: Physician Assistant

## 2024-08-29 NOTE — Telephone Encounter (Signed)
 Spoke with patient today for about 10 minutes regarding the indication for colonoscopy following positive Cologuard test.  He is preoccupied with his elevated PSA and recent prostate MRI.  Urology is recommending a prostate biopsy which he is apprehensive about.    He is frustrated that after not receiving primary care for about 20 years, so much is happening all at once.  I spent some time today explaining to him that these processes may have been in place for many years, and it only seems like everything is happening at once because he finally showed up to primary care.  In any event, we discussed that my personal and professional recommendation is to have the colonoscopy as soon as possible.  He still declines at this time, but agreed to another telephone call in about 2 weeks where we will discuss again.

## 2024-08-29 NOTE — Telephone Encounter (Signed)
-----   Message from Rolan SHAUNNA Hoyle sent at 08/22/2024 11:16 AM EDT ----- Regarding: colonoscopy Revisit need for colonoscopy

## 2024-09-12 ENCOUNTER — Encounter: Payer: Self-pay | Admitting: Physician Assistant

## 2024-09-12 ENCOUNTER — Telehealth: Payer: Self-pay | Admitting: Physician Assistant

## 2024-09-12 DIAGNOSIS — Z91199 Patient's noncompliance with other medical treatment and regimen due to unspecified reason: Secondary | ICD-10-CM | POA: Insufficient documentation

## 2024-09-12 NOTE — Telephone Encounter (Signed)
-----   Message from Rolan SHAUNNA Hoyle sent at 08/29/2024 12:25 PM EDT ----- Regarding: Discuss Colonoscopy again Check in to see if mind has changed about colonoscopy.

## 2024-09-12 NOTE — Telephone Encounter (Signed)
 Spoke with patient via phone today to revisit our previously discussed concerns for prostate and/or colon cancer which we have reviewed extensively over several visits/calls. As of 09/12/24 he does not wish to proceed with any further workup to include prostate biopsy and colonoscopy. He is putting his health in the hands of the Lord.   He states that no family members have had colon or prostate cancer, to which I replied that does not make him immune to either and reminded that cancer always starts somewhere in the family tree.   He recognizes the added risk of smoking and acknowledges this as a major contributor to all types of cancer.   He says he is ready to see his deceased wife again. I voiced my concerns that untreated cancer is a bad way to leave this earth, but as of now he seems asymptomatic.   He will be cancelling his upcoming urology appointments. He will be seeing me next August. I have encouraged him to see me sooner if any new developments, or if he changes his mind.

## 2024-09-26 ENCOUNTER — Other Ambulatory Visit: Admitting: Urology

## 2024-10-03 ENCOUNTER — Ambulatory Visit: Admitting: Urology

## 2024-12-12 ENCOUNTER — Other Ambulatory Visit: Payer: Self-pay | Admitting: Physician Assistant

## 2024-12-13 NOTE — Telephone Encounter (Signed)
 Requested Prescriptions  Pending Prescriptions Disp Refills   albuterol  (VENTOLIN  HFA) 108 (90 Base) MCG/ACT inhaler [Pharmacy Med Name: ALBUTEROL  HFA (PROVENTIL ) INH] 6.7 each 5    Sig: INHALE 2 PUFFS INTO THE LUNGS EVERY 4 HOURS AS NEEDED FOR WHEEZING OR SHORTNESS OF BREATH.     Pulmonology:  Beta Agonists 2 Passed - 12/13/2024 12:43 PM      Passed - Last BP in normal range    BP Readings from Last 1 Encounters:  08/02/24 138/82         Passed - Last Heart Rate in normal range    Pulse Readings from Last 1 Encounters:  08/02/24 74         Passed - Valid encounter within last 12 months    Recent Outpatient Visits           4 months ago Annual physical exam   [facility] Primary Care & Sports Medicine at [facility], Toribio SQUIBB, [place]

## 2024-12-27 ENCOUNTER — Telehealth: Payer: Self-pay

## 2024-12-27 NOTE — Transitions of Care (Post Inpatient/ED Visit) (Signed)
" ° °  12/27/2024  Name: William Garrett MRN: [id number] DOB: [birth year]  Today's TOC FU Call Status: Today's TOC FU Call Status:: Successful TOC FU Call Completed TOC FU Call Complete Date: 12/27/24  Patient's Name and Date of Birth confirmed. Name, DOB  Transition Care Management Follow-up Telephone Call Date of Discharge: 12/26/24 Discharge Facility: Other [facility]) Name of Other (Non[facility]) Discharge Facility: [facility] Type of Discharge: Inpatient Admission Primary Inpatient Discharge Diagnosis:: syncope How have you been since you were released from the hospital?: Better Any questions or concerns?: No  Items Reviewed: Did you receive and understand the discharge instructions provided?: Yes Medications obtained,verified, and reconciled?: Yes (Medications Reviewed) Any new allergies since your discharge?: No Dietary orders reviewed?: Yes Do you have support at home?: Yes People in Home [RPT]: other relative(s)  Medications Reviewed Today: Medications Reviewed Today     Reviewed by Emmitt Pan, LPN (Licensed Practical Nurse) on 12/27/24 at 0915  Med List Status: <None>   Medication Order Taking? Sig Documenting Provider Last Dose Status Informant  acetaminophen (TYLENOL) 325 MG tablet [id number] Yes Take 975 mg by mouth. [provider]  Active   albuterol  (VENTOLIN  HFA) 108 (90 Base) MCG/ACT inhaler [id number] Yes INHALE 2 PUFFS INTO THE LUNGS EVERY 4 HOURS AS NEEDED FOR WHEEZING OR SHORTNESS OF BREATH. Manya Toribio SQUIBB, PA  Active   apixaban (ELIQUIS) 2.5 MG TABS tablet [id number] Yes Take 2.5 mg by mouth. [provider]  Active   Cholecalciferol 50 MCG (2000 UT) TABS [id number] Yes Take 2,000 Units by mouth. [provider]  Active   folic acid (FOLVITE) 1 MG tablet [id number] Yes Take 1 mg by mouth. [provider]  Active   hydrOXYzine  (ATARAX ) 25 MG tablet [id number] Yes Take 1 tablet (25 mg total) by mouth 3 (three) times daily as  needed for anxiety. Manya Toribio SQUIBB, PA  Active   oxyCODONE (OXY IR/ROXICODONE) 5 MG immediate release tablet [id number] Yes Take 5 mg by mouth. [provider]  Active   polyethylene glycol powder (GLYCOLAX/MIRALAX) 17 GM/SCOOP powder [id number] Yes Take 17 g by mouth. [provider]  Active   pravastatin  (PRAVACHOL ) 10 MG tablet [id number] Yes TAKE 1 TABLET BY MOUTH EVERY DAY Manya Toribio SQUIBB, PA  Active   senna (SENOKOT) 8.6 MG tablet [id number] Yes Take 1 tablet by mouth 2 (two) times daily. [provider]  Active             Home Care and Equipment/Supplies: Were Home Health Services Ordered?: Yes Name of Home Health Agency:: unknown Has Agency set up a time to come to your home?: No Any new equipment or medical supplies ordered?: NA  Functional Questionnaire: Do you need assistance with bathing/showering or dressing?: No Do you need assistance with meal preparation?: No Do you need assistance with eating?: No Do you have difficulty maintaining continence: No Do you need assistance with getting out of bed/getting out of a chair/moving?: No Do you have difficulty managing or taking your medications?: No  Follow up appointments reviewed: PCP Follow-up appointment confirmed?: No (declined) MD Provider Line Number:[phone] Given: No Specialist Hospital Follow-up appointment confirmed?: Yes Date of Specialist follow-up appointment?: 01/13/25 Follow-Up Specialty Provider:: ortho Do you need transportation to your follow-up appointment?: No Do you understand care options if your condition(s) worsen?: Yes-patient verbalized understanding    SIGNATURE Pan Emmitt, LPN [facility] Nurse Health Advisor Direct Dial [phone]  "

## 2025-08-05 ENCOUNTER — Encounter: Admitting: Physician Assistant
